# Patient Record
Sex: Female | Born: 1955 | Race: White | Hispanic: No | Marital: Married | Smoking: Former smoker
Health system: Southern US, Community
[De-identification: ages and names within clinical notes are randomized; demographics above are authoritative.]

## PROBLEM LIST (undated history)

## (undated) DIAGNOSIS — C801 Malignant (primary) neoplasm, unspecified: Secondary | ICD-10-CM

## (undated) DIAGNOSIS — E119 Type 2 diabetes mellitus without complications: Secondary | ICD-10-CM

## (undated) DIAGNOSIS — I10 Essential (primary) hypertension: Secondary | ICD-10-CM

## (undated) DIAGNOSIS — E785 Hyperlipidemia, unspecified: Secondary | ICD-10-CM

## (undated) DIAGNOSIS — C3490 Malignant neoplasm of unspecified part of unspecified bronchus or lung: Secondary | ICD-10-CM

## (undated) HISTORY — PX: BREAST BIOPSY: SHX20

## (undated) HISTORY — PX: LUNG CANCER SURGERY: SHX702

## (undated) HISTORY — PX: TONSILLECTOMY: SUR1361

## (undated) HISTORY — PX: CERVICAL SPINE SURGERY: SHX589

## (undated) HISTORY — DX: Hyperlipidemia, unspecified: E78.5

---

## 2007-07-26 ENCOUNTER — Emergency Department: Payer: Self-pay | Admitting: Emergency Medicine

## 2010-04-01 ENCOUNTER — Ambulatory Visit: Payer: Self-pay | Admitting: Family Medicine

## 2010-12-08 ENCOUNTER — Ambulatory Visit: Payer: Self-pay | Admitting: Family Medicine

## 2011-04-25 ENCOUNTER — Ambulatory Visit: Payer: Self-pay | Admitting: Family Medicine

## 2012-07-28 DIAGNOSIS — E119 Type 2 diabetes mellitus without complications: Secondary | ICD-10-CM | POA: Insufficient documentation

## 2012-11-17 DIAGNOSIS — E7800 Pure hypercholesterolemia, unspecified: Secondary | ICD-10-CM | POA: Insufficient documentation

## 2012-12-05 ENCOUNTER — Emergency Department: Payer: Self-pay | Admitting: Emergency Medicine

## 2012-12-15 ENCOUNTER — Ambulatory Visit: Payer: Self-pay | Admitting: Orthopedic Surgery

## 2013-03-03 ENCOUNTER — Ambulatory Visit: Payer: Self-pay | Admitting: Emergency Medicine

## 2013-03-03 LAB — RAPID STREP-A WITH REFLX: Micro Text Report: NEGATIVE

## 2013-03-06 LAB — BETA STREP CULTURE(ARMC)

## 2014-02-27 DIAGNOSIS — R928 Other abnormal and inconclusive findings on diagnostic imaging of breast: Secondary | ICD-10-CM | POA: Insufficient documentation

## 2014-03-26 DIAGNOSIS — Z85828 Personal history of other malignant neoplasm of skin: Secondary | ICD-10-CM | POA: Insufficient documentation

## 2014-06-28 ENCOUNTER — Ambulatory Visit: Payer: Self-pay | Admitting: Family Medicine

## 2014-06-28 LAB — RAPID STREP-A WITH REFLX: Micro Text Report: NEGATIVE

## 2014-07-01 LAB — BETA STREP CULTURE(ARMC)

## 2015-05-09 ENCOUNTER — Other Ambulatory Visit: Payer: Self-pay | Admitting: Rehabilitation

## 2015-05-09 DIAGNOSIS — M4722 Other spondylosis with radiculopathy, cervical region: Secondary | ICD-10-CM

## 2015-05-20 ENCOUNTER — Ambulatory Visit
Admission: RE | Admit: 2015-05-20 | Discharge: 2015-05-20 | Disposition: A | Discharge status: Home / Self Care | Payer: 59 | Source: Ambulatory Visit | Attending: Rehabilitation | Admitting: Rehabilitation

## 2015-05-20 ENCOUNTER — Ambulatory Visit: Payer: Self-pay

## 2015-05-20 DIAGNOSIS — M4722 Other spondylosis with radiculopathy, cervical region: Secondary | ICD-10-CM

## 2015-05-21 ENCOUNTER — Ambulatory Visit
Admission: RE | Admit: 2015-05-21 | Discharge: 2015-05-21 | Disposition: A | Discharge status: Home / Self Care | Payer: 59 | Source: Ambulatory Visit | Attending: Rehabilitation | Admitting: Rehabilitation

## 2015-05-21 DIAGNOSIS — M4802 Spinal stenosis, cervical region: Secondary | ICD-10-CM | POA: Insufficient documentation

## 2015-05-21 DIAGNOSIS — M50221 Other cervical disc displacement at C4-C5 level: Secondary | ICD-10-CM | POA: Insufficient documentation

## 2015-05-21 DIAGNOSIS — M4722 Other spondylosis with radiculopathy, cervical region: Secondary | ICD-10-CM | POA: Insufficient documentation

## 2015-10-07 ENCOUNTER — Ambulatory Visit (INDEPENDENT_AMBULATORY_CARE_PROVIDER_SITE_OTHER): Payer: 59 | Admitting: Neurology

## 2015-10-07 ENCOUNTER — Ambulatory Visit (INDEPENDENT_AMBULATORY_CARE_PROVIDER_SITE_OTHER): Payer: Self-pay | Admitting: Neurology

## 2015-10-07 DIAGNOSIS — Z0289 Encounter for other administrative examinations: Secondary | ICD-10-CM

## 2015-10-07 DIAGNOSIS — M79601 Pain in right arm: Secondary | ICD-10-CM | POA: Diagnosis not present

## 2015-10-07 NOTE — Progress Notes (Signed)
See procedure note.

## 2015-10-07 NOTE — Progress Notes (Signed)
  [facility]    Provider:  Dr Jaynee Eagles Referring Provider: Gayland Curry, MD Primary Care Physician:  Gayland Curry, MD  CC:  Right arm pain.  HPI:  Laurie Lyons is a 60 y.o. female here as a referral from Dr. Astrid Divine for right arm pain and right C6 symptoms.   Summary: Nerve Conduction studies were performed on the bilateral upper extremities.  ADM Ulnar and APB Median motor conductions were within normal limits with normal F wave latencies.  2nd-digit Median, 5th-digit Ulnar and radial sensory conductions were within normal limits.   EMG needle evaluation was performed on selected right upper extremity muscles: The right Infraspinatus, right Supraspinatus, right Deltoid, right Biceps, right Triceps, right Brachioradialis, right Extensor indicis, right Extensor Digitorum Communis, right Pronator Teres, right First Dorsal Interoseous, right Opponens Pollicis(could not activate the opponens pollicis due to pain) and right C6/C7, C7/C8 paraspinal muscles were within normal limits.   Assessment/Plan:  This is a normal study. No electrophysiologic evidence for mononeuropathy, polyneuropathy, cervical radiculopathy, plexopathy or muscle disorder.  Sarina Ill, MD  [facility] [address] [place], [place] [address]  Phone [phone] Fax [phone]

## 2015-10-12 NOTE — Procedures (Signed)
  [id number]    Provider:  Dr Jaynee Eagles Referring Provider: Gayland Curry, MD Primary Care Physician:  Gayland Curry, MD  CC:  Right arm pain.  HPI:  Laurie Lyons is a 60 y.o. female here as a referral from Dr. Astrid Divine for right arm pain and right C6 symptoms.   Summary: Nerve Conduction studies were performed on the bilateral upper extremities.  ADM Ulnar and APB Median motor conductions were within normal limits with normal F wave latencies.  2nd-digit Median, 5th-digit Ulnar and radial sensory conductions were within normal limits.   EMG needle evaluation was performed on selected right upper extremity muscles: The right Infraspinatus, right Supraspinatus, right Deltoid, right Biceps, right Triceps, right Brachioradialis, right Extensor indicis, right Extensor Digitorum Communis, right Pronator Teres, right First Dorsal Interoseous, right Opponens Pollicis(could not activate the opponens pollicis due to pain) and right C6/C7, C7/C8 paraspinal muscles were within normal limits.   Assessment/Plan:  This is a normal study. No electrophysiologic evidence for mononeuropathy, polyneuropathy, cervical radiculopathy, plexopathy or muscle disorder.  Sarina Ill, MD  [facility] [address] [place],  [address]  Phone [phone] Fax [phone]

## 2016-03-18 ENCOUNTER — Other Ambulatory Visit
Admission: RE | Admit: 2016-03-18 | Discharge: 2016-03-18 | Disposition: A | Discharge status: Home / Self Care | Payer: 59 | Source: Ambulatory Visit | Attending: Gastroenterology | Admitting: Gastroenterology

## 2016-03-18 DIAGNOSIS — R197 Diarrhea, unspecified: Secondary | ICD-10-CM | POA: Insufficient documentation

## 2016-03-18 LAB — GASTROINTESTINAL PANEL BY PCR, STOOL (REPLACES STOOL CULTURE)
ASTROVIRUS: NOT DETECTED
Adenovirus F40/41: NOT DETECTED
CAMPYLOBACTER SPECIES: NOT DETECTED
Cryptosporidium: NOT DETECTED
Cyclospora cayetanensis: NOT DETECTED
E. coli O157: NOT DETECTED
ENTAMOEBA HISTOLYTICA: NOT DETECTED
ENTEROAGGREGATIVE E COLI (EAEC): NOT DETECTED
ENTEROPATHOGENIC E COLI (EPEC): NOT DETECTED
ENTEROTOXIGENIC E COLI (ETEC): NOT DETECTED
GIARDIA LAMBLIA: NOT DETECTED
NOROVIRUS GI/GII: NOT DETECTED
PLESIMONAS SHIGELLOIDES: NOT DETECTED
Rotavirus A: NOT DETECTED
SHIGELLA/ENTEROINVASIVE E COLI (EIEC): NOT DETECTED
Salmonella species: NOT DETECTED
Sapovirus (I, II, IV, and V): NOT DETECTED
Shiga like toxin producing E coli (STEC): NOT DETECTED
VIBRIO CHOLERAE: NOT DETECTED
Vibrio species: NOT DETECTED
Yersinia enterocolitica: NOT DETECTED

## 2016-03-18 LAB — C DIFFICILE QUICK SCREEN W PCR REFLEX
C DIFFICILE (CDIFF) INTERP: NOT DETECTED
C DIFFICILE (CDIFF) TOXIN: NEGATIVE
C Diff antigen: NEGATIVE

## 2016-06-08 ENCOUNTER — Ambulatory Visit
Admission: RE | Admit: 2016-06-08 | Discharge: 2016-06-08 | Disposition: A | Discharge status: Home / Self Care | Payer: 59 | Source: Ambulatory Visit | Attending: Gastroenterology | Admitting: Gastroenterology

## 2016-06-08 ENCOUNTER — Ambulatory Visit: Payer: 59 | Admitting: Anesthesiology

## 2016-06-08 ENCOUNTER — Encounter
Admission: RE | Disposition: A | Discharge status: Home / Self Care | Payer: Self-pay | Source: Ambulatory Visit | Attending: Gastroenterology

## 2016-06-08 ENCOUNTER — Encounter: Payer: Self-pay | Admitting: *Deleted

## 2016-06-08 DIAGNOSIS — D122 Benign neoplasm of ascending colon: Secondary | ICD-10-CM | POA: Diagnosis not present

## 2016-06-08 DIAGNOSIS — E119 Type 2 diabetes mellitus without complications: Secondary | ICD-10-CM | POA: Insufficient documentation

## 2016-06-08 DIAGNOSIS — Z7982 Long term (current) use of aspirin: Secondary | ICD-10-CM | POA: Diagnosis not present

## 2016-06-08 DIAGNOSIS — I10 Essential (primary) hypertension: Secondary | ICD-10-CM | POA: Insufficient documentation

## 2016-06-08 DIAGNOSIS — K621 Rectal polyp: Secondary | ICD-10-CM | POA: Insufficient documentation

## 2016-06-08 DIAGNOSIS — Z880 Allergy status to penicillin: Secondary | ICD-10-CM | POA: Insufficient documentation

## 2016-06-08 DIAGNOSIS — R197 Diarrhea, unspecified: Secondary | ICD-10-CM | POA: Diagnosis present

## 2016-06-08 HISTORY — DX: Type 2 diabetes mellitus without complications: E11.9

## 2016-06-08 HISTORY — PX: COLONOSCOPY: SHX5424

## 2016-06-08 HISTORY — DX: Essential (primary) hypertension: I10

## 2016-06-08 LAB — GLUCOSE, CAPILLARY: Glucose-Capillary: 225 mg/dL — ABNORMAL HIGH (ref 65–99)

## 2016-06-08 SURGERY — COLONOSCOPY
Anesthesia: General

## 2016-06-08 MED ORDER — ONDANSETRON HCL 4 MG/2ML IJ SOLN: 4.0000 mg | Freq: Once | INTRAMUSCULAR | Status: DC | PRN

## 2016-06-08 MED ORDER — FENTANYL CITRATE (PF) 100 MCG/2ML IJ SOLN
INTRAMUSCULAR | Status: DC | PRN
  Administered 2016-06-08: 50 ug via INTRAVENOUS

## 2016-06-08 MED ORDER — MIDAZOLAM HCL 5 MG/5ML IJ SOLN
INTRAMUSCULAR | Status: DC | PRN
  Administered 2016-06-08: 1 mg via INTRAVENOUS

## 2016-06-08 MED ORDER — SODIUM CHLORIDE 0.9 % IV SOLN: INTRAVENOUS | Status: DC

## 2016-06-08 MED ORDER — PROPOFOL 500 MG/50ML IV EMUL
INTRAVENOUS | Status: DC | PRN
  Administered 2016-06-08: 120 ug/kg/min via INTRAVENOUS

## 2016-06-08 MED ORDER — LIDOCAINE 2% (20 MG/ML) 5 ML SYRINGE
INTRAMUSCULAR | Status: DC | PRN
  Administered 2016-06-08: 50 mg via INTRAVENOUS

## 2016-06-08 MED ORDER — SODIUM CHLORIDE 0.9 % IV SOLN
INTRAVENOUS | Status: DC
  Administered 2016-06-08: 08:00:00 via INTRAVENOUS

## 2016-06-08 MED ORDER — FENTANYL CITRATE (PF) 100 MCG/2ML IJ SOLN: 25.0000 ug | INTRAMUSCULAR | Status: DC | PRN

## 2016-06-08 MED ORDER — PROPOFOL 10 MG/ML IV BOLUS
INTRAVENOUS | Status: DC | PRN
  Administered 2016-06-08: 30 mg via INTRAVENOUS
  Administered 2016-06-08: 70 mg via INTRAVENOUS

## 2016-06-08 NOTE — H&P (Signed)
Outpatient short stay form Pre-procedure 06/08/2016 7:44 AM Lollie Sails MD  Primary Physician: Dr. Gayland Curry  Reason for visit:  Colonoscopy  History of present illness:  Patient is a 60 year old female presenting today as above. She's had several months of change of bowel habits into a more of a diarrhea problem. She's not seen any blood in the stool. She's had no new or different medications. No new medication doses. She does take a 81 mg aspirin daily but is held that for a couple days. She takes no blood thinning agents or other aspirin products. She tolerated her prep although she did have some nausea.    Current Facility-Administered Medications:  .  0.9 %  sodium chloride infusion, , Intravenous, Continuous, Lollie Sails, MD, Last Rate: 50 mL/hr at 06/08/16 0738 .  0.9 %  sodium chloride infusion, , Intravenous, Continuous, Lollie Sails, MD .  fentaNYL (SUBLIMAZE) injection 25 mcg, 25 mcg, Intravenous, Q5 min PRN, Alvin Critchley, MD .  ondansetron [facility]) injection 4 mg, 4 mg, Intravenous, Once PRN, Alvin Critchley, MD  Prescriptions Prior to Admission  Medication Sig Dispense Refill Last Dose  . aspirin EC 81 MG tablet Take 81 mg by mouth daily.   Past Week at Unknown time  . gabapentin (NEURONTIN) 300 MG capsule Take 300 mg by mouth 3 (three) times daily.   Past Week at Unknown time  . glipiZIDE-metformin (METAGLIP) 5-500 MG tablet Take 1 tablet by mouth 2 (two) times daily before a meal.   Past Week at Unknown time  . lisinopril (PRINIVIL,ZESTRIL) 5 MG tablet Take 5 mg by mouth daily.   Past Week at Unknown time  . rosuvastatin (CRESTOR) 40 MG tablet Take 40 mg by mouth daily.   Past Week at Unknown time  . silver sulfADIAZINE (SILVADENE) 1 % cream Apply 1 application topically daily.   Completed Course at Unknown time     Allergies  Allergen Reactions  . Penicillins   . Sulfa Antibiotics      Past Medical History:  Diagnosis Date  . Diabetes  mellitus without complication ([place])   . Hypertension     Review of systems:      Physical Exam    Heart and lungs: Regular rate and rhythm without rub or gallop, lungs are bilaterally clear.    HEENT: Normocephalic atraumatic eyes are anicteric    Other:     Pertinant exam for procedure: Soft nontender nondistended bowel sounds positive normoactive.    Planned proceedures: Colonoscopy and indicated procedures. I have discussed the risks benefits and complications of procedures to include not limited to bleeding, infection, perforation and the risk of sedation and the patient wishes to proceed.    Lollie Sails, MD Gastroenterology 06/08/2016  7:44 AM

## 2016-06-08 NOTE — Op Note (Signed)
[facility] Patient Name: Laurie Lyons Procedure Date: 06/08/2016 7:49 AM MRN: [id number] Account #: [id number] Date of Birth: 10/10/1955 Admit Type: Outpatient Age: 60 Room: [facility] ENDO ROOM 3 Gender: Female Note Status: Finalized Procedure:            Colonoscopy Indications:          Change in bowel habits, Diarrhea Providers:            Lollie Sails, MD Referring MD:         Gayland Curry MD, MD (Referring MD) Medicines:            Monitored Anesthesia Care Complications:        No immediate complications. Procedure:            Pre-Anesthesia Assessment:                       - ASA Grade Assessment: II - A patient with mild                        systemic disease.                       After obtaining informed consent, the colonoscope was                        passed under direct vision. Throughout the procedure,                        the patient's blood pressure, pulse, and oxygen                        saturations were monitored continuously. The                        Colonoscope was introduced through the anus and                        advanced to the the cecum, identified by appendiceal                        orifice and ileocecal valve. The colonoscopy was                        performed without difficulty. The patient tolerated the                        procedure well. The quality of the bowel preparation                        was good. Findings:      A 2 to 3 mm polyp was found in the rectum. The polyp was sessile.       Polypectomy was attempted, initially using a cold biopsy forceps. Polyp       resection was incomplete with this device. This intervention then       required a different device and polypectomy technique. The polyp was       removed with a cold snare. Resection and retrieval were complete.      A 2 mm polyp was found in the ascending colon. The polyp was sessile.       The polyp  was removed with a cold  biopsy forceps. Resection and       retrieval were complete.      Biopsies for histology were taken with a cold forceps from the right       colon and left colon for evaluation of microscopic colitis.      Non-bleeding internal hemorrhoids were found during retroflexion. The       hemorrhoids were small and Grade I (internal hemorrhoids that do not       prolapse).      -of note , the colonic pathway seems relatively short Impression:           - One 2 to 3 mm polyp in the rectum, removed with a                        cold snare. Resected and retrieved.                       - One 2 mm polyp in the ascending colon, removed with a                        cold biopsy forceps. Resected and retrieved.                       - Non-bleeding internal hemorrhoids.                       - Biopsies were taken with a cold forceps from the                        right colon and left colon for evaluation of                        microscopic colitis. Recommendation:       - Discharge patient to home.                       - Telephone GI clinic for pathology results in 1 week.                       - Return to GI clinic in 4 weeks.                       - Use Citrucel one tablespoon PO daily daily.                       - lactose test Procedure Code(s):    --- Professional ---                       [phone], Colonoscopy, flexible; with removal of tumor(s),                        polyp(s), or other lesion(s) by snare technique                       45380, 59, Colonoscopy, flexible; with biopsy, single                        or multiple Diagnosis Code(s):    --- Professional ---  K62.1, Rectal polyp                       D12.2, Benign neoplasm of ascending colon                       K64.0, First degree hemorrhoids                       R19.4, Change in bowel habit                       R19.7, Diarrhea, unspecified CPT copyright 2016 American Medical Association. All rights  reserved. The codes documented in this report are preliminary and upon coder review may  be revised to meet current compliance requirements. Lollie Sails, MD 06/08/2016 8:38:58 AM This report has been signed electronically. Number of Addenda: 0 Note Initiated On: 06/08/2016 7:49 AM Scope Withdrawal Time: 0 hours 10 minutes 51 seconds  Total Procedure Duration: 0 hours 24 minutes 31 seconds       [facility]

## 2016-06-08 NOTE — Anesthesia Preprocedure Evaluation (Signed)
Anesthesia Evaluation  Patient identified by MRN, date of birth, ID band Patient awake    Reviewed: Allergy & Precautions, NPO status , Patient's Chart, lab work & pertinent test results  Airway Mallampati: II       Dental no notable dental hx.    Pulmonary neg pulmonary ROS,    Pulmonary exam normal        Cardiovascular hypertension, Pt. on medications Normal cardiovascular exam     Neuro/Psych negative neurological ROS     GI/Hepatic negative GI ROS, Neg liver ROS,   Endo/Other  negative endocrine ROS  Renal/GU negative Renal ROS  negative genitourinary   Musculoskeletal negative musculoskeletal ROS (+)   Abdominal Normal abdominal exam  (+)   Peds negative pediatric ROS (+)  Hematology negative hematology ROS (+)   Anesthesia Other Findings   Reproductive/Obstetrics                             Anesthesia Physical Anesthesia Plan  ASA: II  Anesthesia Plan: General   Post-op Pain Management:    Induction: Intravenous  Airway Management Planned: Nasal Cannula  Additional Equipment:   Intra-op Plan:   Post-operative Plan:   Informed Consent: I have reviewed the patients History and Physical, chart, labs and discussed the procedure including the risks, benefits and alternatives for the proposed anesthesia with the patient or authorized representative who has indicated his/her understanding and acceptance.   Dental advisory given  Plan Discussed with: CRNA and Surgeon  Anesthesia Plan Comments:         Anesthesia Quick Evaluation

## 2016-06-08 NOTE — Transfer of Care (Signed)
Immediate Anesthesia Transfer of Care Note  Patient: Laurie Lyons  Procedure(s) Performed: Procedure(s) with comments: COLONOSCOPY (N/A) - Diabetic  Patient Location: Endoscopy Unit  Anesthesia Type:General  Level of Consciousness: awake and alert   Airway & Oxygen Therapy: Patient Spontanous Breathing and Patient connected to nasal cannula oxygen  Post-op Assessment: Report given to RN and Post -op Vital signs reviewed and stable  Post vital signs: Reviewed  Last Vitals:  Vitals:   06/08/16 0727 06/08/16 0826  BP: 132/89 110/63  Pulse: 79 69  Resp: 20 18  Temp: (!) 35.8 C (!) 36.1 C    Last Pain:  Vitals:   06/08/16 0727  TempSrc: Tympanic         Complications: No apparent anesthesia complications

## 2016-06-09 ENCOUNTER — Encounter: Payer: Self-pay | Admitting: Gastroenterology

## 2016-06-09 LAB — SURGICAL PATHOLOGY

## 2016-06-09 NOTE — Anesthesia Postprocedure Evaluation (Signed)
Anesthesia Post Note  Patient: CYANNE DELMAR  Procedure(s) Performed: Procedure(s) (LRB): COLONOSCOPY (N/A)  Patient location during evaluation: PACU Anesthesia Type: General Level of consciousness: awake and alert and oriented Pain management: pain level controlled Vital Signs Assessment: post-procedure vital signs reviewed and stable Respiratory status: spontaneous breathing Cardiovascular status: blood pressure returned to baseline Anesthetic complications: no    Last Vitals:  Vitals:   06/08/16 0836 06/08/16 0856  BP: 113/63 116/74  Pulse: 63 62  Resp: 14 15  Temp:      Last Pain:  Vitals:   06/09/16 0740  TempSrc:   PainSc: 0-No pain                 Dacota Devall

## 2016-09-20 ENCOUNTER — Encounter: Payer: Self-pay | Admitting: *Deleted

## 2016-09-20 ENCOUNTER — Ambulatory Visit
Admission: EM | Admit: 2016-09-20 | Discharge: 2016-09-20 | Disposition: A | Discharge status: Home / Self Care | Payer: 59 | Attending: Family Medicine | Admitting: Family Medicine

## 2016-09-20 DIAGNOSIS — H8110 Benign paroxysmal vertigo, unspecified ear: Secondary | ICD-10-CM

## 2016-09-20 DIAGNOSIS — A084 Viral intestinal infection, unspecified: Secondary | ICD-10-CM | POA: Diagnosis not present

## 2016-09-20 MED ORDER — ONDANSETRON 8 MG PO TBDP: 8.0000 mg | ORAL_TABLET | Freq: Three times a day (TID) | ORAL | 0 refills | Status: AC | PRN

## 2016-09-20 MED ORDER — MECLIZINE HCL 12.5 MG PO TABS
12.5000 mg | ORAL_TABLET | Freq: Three times a day (TID) | ORAL | 0 refills | Status: AC | PRN
Start: 2016-09-20 — End: ?

## 2016-09-20 MED ORDER — ONDANSETRON 8 MG PO TBDP
8.0000 mg | ORAL_TABLET | Freq: Once | ORAL | Status: AC
  Administered 2016-09-20: 8 mg via ORAL

## 2016-09-20 NOTE — ED Provider Notes (Signed)
[facility]    CSN: [id number] Arrival date & time: 09/20/16  0918     History   Chief Complaint Chief Complaint  Patient presents with  . Dizziness  . Nausea  . Emesis  . Otalgia    HPI Laurie Lyons is a 61 y.o. female.   The history is provided by the patient.  Dizziness  Quality:  Vertigo Severity:  Moderate Onset quality:  Sudden Timing:  Constant Chronicity:  New Context: ear pain and head movement   Context: not when bending over, not with bowel movement, not with eye movement, not with inactivity, not with loss of consciousness, not with medication, not with physical activity, not when standing up and not when urinating   Associated symptoms: vomiting   Associated symptoms: no diarrhea   Emesis  Severity:  Moderate Duration:  10 hours Timing:  Intermittent Quality:  Stomach contents Progression:  Unchanged Chronicity:  New Recent urination:  Normal Associated symptoms: no diarrhea   Otalgia  Associated symptoms: vomiting   Associated symptoms: no diarrhea     Past Medical History:  Diagnosis Date  . Diabetes mellitus without complication ([place])   . Hypertension     There are no active problems to display for this patient.   Past Surgical History:  Procedure Laterality Date  . CESAREAN SECTION    . COLONOSCOPY N/A 06/08/2016   Procedure: COLONOSCOPY;  Surgeon: Lollie Sails, MD;  Location: [facility] ENDOSCOPY;  Service: Endoscopy;  Laterality: N/A;  Diabetic  . TONSILLECTOMY      OB History    No data available       Home Medications    Prior to Admission medications   Medication Sig Start Date End Date Taking? Authorizing Provider  gabapentin (NEURONTIN) 300 MG capsule Take 300 mg by mouth 3 (three) times daily.   Yes Historical Provider, MD  glipiZIDE-metformin (METAGLIP) 5-500 MG tablet Take 1 tablet by mouth 2 (two) times daily before a meal.   Yes Historical Provider, MD  lisinopril (PRINIVIL,ZESTRIL) 5 MG tablet Take  5 mg by mouth daily.   Yes Historical Provider, MD  rosuvastatin (CRESTOR) 40 MG tablet Take 40 mg by mouth daily.   Yes Historical Provider, MD  aspirin EC 81 MG tablet Take 81 mg by mouth daily.    Historical Provider, MD  meclizine (ANTIVERT) 12.5 MG tablet Take 1 tablet (12.5 mg total) by mouth 3 (three) times daily as needed for dizziness. 09/20/16   Norval Gable, MD  ondansetron (ZOFRAN ODT) 8 MG disintegrating tablet Take 1 tablet (8 mg total) by mouth every 8 (eight) hours as needed. 09/20/16   Norval Gable, MD  silver sulfADIAZINE (SILVADENE) 1 % cream Apply 1 application topically daily.    Historical Provider, MD    Family History History reviewed. No pertinent family history.  Social History Social History  Substance Use Topics  . Smoking status: Former Research scientist (life sciences)  . Smokeless tobacco: Never Used  . Alcohol use No     Allergies   Penicillins and Sulfa antibiotics   Review of Systems Review of Systems  HENT: Positive for ear pain.   Gastrointestinal: Positive for vomiting. Negative for diarrhea.  Neurological: Positive for dizziness.     Physical Exam Triage Vital Signs ED Triage Vitals  Enc Vitals Group     BP 09/20/16 0950 129/80     Pulse Rate 09/20/16 0950 71     Resp 09/20/16 0950 16     Temp 09/20/16 0950 97.6  F (36.4 C)     Temp Source 09/20/16 0950 Oral     SpO2 09/20/16 0950 97 %     Weight 09/20/16 0952 165 lb (74.8 kg)     Height 09/20/16 0952 '5\' 4"'$  (1.626 m)     Head Circumference --      Peak Flow --      Pain Score --      Pain Loc --      Pain Edu? --      Excl. in [place]? --    No data found.   Updated Vital Signs BP 129/80 (BP Location: Left Arm)   Pulse 71   Temp 97.6 F (36.4 C) (Oral)   Resp 16   Ht '5\' 4"'$  (1.626 m)   Wt 165 lb (74.8 kg)   SpO2 97%   BMI 28.32 kg/m   Visual Acuity Right Eye Distance:   Left Eye Distance:   Bilateral Distance:    Right Eye Near:   Left Eye Near:    Bilateral Near:     Physical Exam    Constitutional: She is oriented to person, place, and time. She appears well-developed and well-nourished. No distress.  HENT:  Head: Normocephalic and atraumatic.  Right Ear: Tympanic membrane, external ear and ear canal normal.  Left Ear: Tympanic membrane, external ear and ear canal normal.  Nose: No mucosal edema, rhinorrhea, nose lacerations, sinus tenderness, nasal deformity, septal deviation or nasal septal hematoma. No epistaxis.  No foreign bodies. Right sinus exhibits no maxillary sinus tenderness and no frontal sinus tenderness. Left sinus exhibits no maxillary sinus tenderness and no frontal sinus tenderness.  Mouth/Throat: Uvula is midline, oropharynx is clear and moist and mucous membranes are normal. No oropharyngeal exudate.  Eyes: Conjunctivae and EOM are normal. Pupils are equal, round, and reactive to light. Right eye exhibits no discharge. Left eye exhibits no discharge. No scleral icterus.  Neck: Normal range of motion. Neck supple. No thyromegaly present.  Cardiovascular: Normal rate, regular rhythm and normal heart sounds.   Pulmonary/Chest: Effort normal and breath sounds normal. No respiratory distress. She has no wheezes. She has no rales.  Abdominal: Soft. Bowel sounds are normal. She exhibits no distension and no mass. There is no tenderness. There is no rebound and no guarding. No hernia.  Lymphadenopathy:    She has no cervical adenopathy.  Neurological: She is alert and oriented to person, place, and time. She displays normal reflexes. No cranial nerve deficit or sensory deficit. She exhibits normal muscle tone. Coordination normal.  Positive Hallpike manuever  Skin: She is not diaphoretic.  Nursing note and vitals reviewed.    UC Treatments / Results  Labs (all labs ordered are listed, but only abnormal results are displayed) Labs Reviewed - No data to display  EKG  EKG Interpretation None       Radiology No results found.  Procedures Procedures  (including critical care time)  Medications Ordered in UC Medications  ondansetron (ZOFRAN-ODT) disintegrating tablet 8 mg (8 mg Oral Given 09/20/16 0956)     Initial Impression / Assessment and Plan / UC Course  I have reviewed the triage vital signs and the nursing notes.  Pertinent labs & imaging results that were available during my care of the patient were reviewed by me and considered in my medical decision making (see chart for details).      Final Clinical Impressions(s) / UC Diagnoses   Final diagnoses:  Benign paroxysmal positional vertigo, unspecified laterality  Viral  gastroenteritis    New Prescriptions Discharge Medication List as of 09/20/2016 10:49 AM    START taking these medications   Details  meclizine (ANTIVERT) 12.5 MG tablet Take 1 tablet (12.5 mg total) by mouth 3 (three) times daily as needed for dizziness., Starting Mon 09/20/2016, Normal    ondansetron (ZOFRAN ODT) 8 MG disintegrating tablet Take 1 tablet (8 mg total) by mouth every 8 (eight) hours as needed., Starting Mon 09/20/2016, Normal       1. diagnosis reviewed with patient 2. rx as per orders above; reviewed possible side effects, interactions, risks and benefits  3. Recommend supportive treatment with clear liquids/increased fluids, then increase diet as tolerated 4. Follow-up prn if symptoms worsen or don't improve   Norval Gable, MD 09/20/16 1416

## 2016-09-20 NOTE — ED Triage Notes (Signed)
Onset of dizziness last night, N/V this am. Dizziness worse this morning. Had left ear fullness and pain for 1 week.

## 2016-09-21 ENCOUNTER — Ambulatory Visit
Admission: EM | Admit: 2016-09-21 | Discharge: 2016-09-21 | Disposition: A | Discharge status: Home / Self Care | Payer: 59 | Attending: Family Medicine | Admitting: Family Medicine

## 2016-09-21 ENCOUNTER — Encounter: Payer: Self-pay | Admitting: *Deleted

## 2016-09-21 DIAGNOSIS — H6982 Other specified disorders of Eustachian tube, left ear: Secondary | ICD-10-CM

## 2016-09-21 MED ORDER — HYDROCODONE-ACETAMINOPHEN 5-325 MG PO TABS: 1.0000 | ORAL_TABLET | Freq: Four times a day (QID) | ORAL | 0 refills | Status: DC | PRN

## 2016-09-21 MED ORDER — FLUTICASONE PROPIONATE 50 MCG/ACT NA SUSP: 2.0000 | Freq: Every day | NASAL | 0 refills | Status: DC

## 2016-09-21 MED ORDER — CEFUROXIME AXETIL 250 MG PO TABS: ORAL_TABLET | ORAL | 0 refills | Status: DC

## 2016-09-21 NOTE — ED Triage Notes (Signed)
Pt seen here yesterday, returns today with increased left ear pain, fullness, and tenderness to mastoid process region and left jaw.

## 2016-09-21 NOTE — ED Provider Notes (Signed)
CSN: [id number]     Arrival date & time 09/21/16  1731 History   First MD Initiated Contact with Patient 09/21/16 1814     Chief Complaint  Patient presents with  . Otalgia   (Consider location/radiation/quality/duration/timing/severity/associated sxs/prior Treatment) HPI  61 year old female who was seen here yesterdaywith benign positional vertigo scribed Antivert. She states that she has improved with the dizziness having a tight on occasion now working well. She returns today complaining of right ear pain and muffling and pain extending down her jaw line and over the mastoid. She is afebrile , O2 sats 98% on room air.       Past Medical History:  Diagnosis Date  . Diabetes mellitus without complication ([place])   . Hypertension    Past Surgical History:  Procedure Laterality Date  . CESAREAN SECTION    . COLONOSCOPY N/A 06/08/2016   Procedure: COLONOSCOPY;  Surgeon: Lollie Sails, MD;  Location: [facility] ENDOSCOPY;  Service: Endoscopy;  Laterality: N/A;  Diabetic  . TONSILLECTOMY     History reviewed. No pertinent family history. Social History  Substance Use Topics  . Smoking status: Former Research scientist (life sciences)  . Smokeless tobacco: Never Used  . Alcohol use No   OB History    No data available     Review of Systems  Constitutional: Positive for activity change. Negative for chills, fatigue and fever.  HENT: Positive for ear pain.   All other systems reviewed and are negative.   Allergies  Penicillins and Sulfa antibiotics  Home Medications   Prior to Admission medications   Medication Sig Start Date End Date Taking? Authorizing Provider  aspirin EC 81 MG tablet Take 81 mg by mouth daily.    Historical Provider, MD  cefUROXime (CEFTIN) 250 MG tablet Take one tablet BID with food 09/21/16   Lorin Picket, PA-C  fluticasone (FLONASE) 50 MCG/ACT nasal spray Place 2 sprays into both nostrils daily. 09/21/16   Lorin Picket, PA-C  gabapentin (NEURONTIN) 300 MG capsule Take 300  mg by mouth 3 (three) times daily.    Historical Provider, MD  glipiZIDE-metformin (METAGLIP) 5-500 MG tablet Take 1 tablet by mouth 2 (two) times daily before a meal.    Historical Provider, MD  HYDROcodone-acetaminophen (NORCO/VICODIN) 5-325 MG tablet Take 1 tablet by mouth every 6 (six) hours as needed for moderate pain or severe pain. 09/21/16   Lorin Picket, PA-C  lisinopril (PRINIVIL,ZESTRIL) 5 MG tablet Take 5 mg by mouth daily.    Historical Provider, MD  meclizine (ANTIVERT) 12.5 MG tablet Take 1 tablet (12.5 mg total) by mouth 3 (three) times daily as needed for dizziness. 09/20/16   Norval Gable, MD  ondansetron (ZOFRAN ODT) 8 MG disintegrating tablet Take 1 tablet (8 mg total) by mouth every 8 (eight) hours as needed. 09/20/16   Norval Gable, MD  rosuvastatin (CRESTOR) 40 MG tablet Take 40 mg by mouth daily.    Historical Provider, MD  silver sulfADIAZINE (SILVADENE) 1 % cream Apply 1 application topically daily.    Historical Provider, MD   Meds Ordered and Administered this Visit  Medications - No data to display  BP (!) 148/76 (BP Location: Left Arm)   Pulse 72   Temp 97.8 F (36.6 C) (Oral)   Resp 16   Ht '5\' 3"'$  (1.6 m)   Wt 165 lb (74.8 kg)   SpO2 98%   BMI 29.23 kg/m  No data found.   Physical Exam  Constitutional: She is oriented to person,  place, and time. She appears well-developed and well-nourished. No distress.  HENT:  Head: Normocephalic and atraumatic.  Right Ear: External ear normal.  Nose: Nose normal.  Mouth/Throat: Oropharynx is clear and moist.  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 The TM is dark. Is tenderness to outpatient over the mastoid pain with movement of the article or tragus.  Eyes: EOM are normal. Pupils are equal, round, and reactive to light. Right eye exhibits no discharge. Left eye exhibits no discharge.  Neck: Normal range of motion. Neck supple.  Musculoskeletal: Normal range of motion.  Lymphadenopathy:    She has no cervical  adenopathy.  Neurological: She is alert and oriented to person, place, and time.  Skin: Skin is warm and dry. She is not diaphoretic.  Psychiatric: She has a normal mood and affect. Her behavior is normal. Judgment and thought content normal.  Nursing note and vitals reviewed.   Urgent Care Course     Procedures (including critical care time)  Labs Review Labs Reviewed - No data to display  Imaging Review No results found.   Visual Acuity Review  Right Eye Distance:   Left Eye Distance:   Bilateral Distance:    Right Eye Near:   Left Eye Near:    Bilateral Near:         MDM   1. Eustachian tube dysfunction, left    Discharge Medication List as of 09/21/2016  6:35 PM    START taking these medications   Details  cefUROXime (CEFTIN) 250 MG tablet Take one tablet BID with food, Normal    fluticasone (FLONASE) 50 MCG/ACT nasal spray Place 2 sprays into both nostrils daily., Starting Tue 09/21/2016, Normal    HYDROcodone-acetaminophen (NORCO/VICODIN) 5-325 MG tablet Take 1 tablet by mouth every 6 (six) hours as needed for moderate pain or severe pain., Starting Tue 09/21/2016, Print      Plan: 1. Test/x-ray results and diagnosis reviewed with patient 2. rx as per orders; risks, benefits, potential side effects reviewed with patient 3. Recommend supportive treatment with Motrin or Tylenol for pain saving the Vicodin for severe pain. Given her the name of ENT that she can follow-up with if she is not improving. 4. F/u prn if symptoms worsen or don't improve     Lorin Picket, PA-C 09/21/16 2158

## 2016-09-23 ENCOUNTER — Telehealth: Payer: Self-pay

## 2016-09-23 NOTE — Telephone Encounter (Signed)
Courtesy call back completed today after patient's visit at [facility]. Patient improved and will call back with any questions or concerns.  

## 2016-09-30 ENCOUNTER — Ambulatory Visit
Admission: EM | Admit: 2016-09-30 | Discharge: 2016-09-30 | Disposition: A | Discharge status: Home / Self Care | Payer: 59 | Attending: Emergency Medicine | Admitting: Emergency Medicine

## 2016-09-30 DIAGNOSIS — R05 Cough: Secondary | ICD-10-CM

## 2016-09-30 DIAGNOSIS — R059 Cough, unspecified: Secondary | ICD-10-CM

## 2016-09-30 MED ORDER — AEROCHAMBER PLUS MISC: 2 refills | Status: DC

## 2016-09-30 MED ORDER — ALBUTEROL SULFATE HFA 108 (90 BASE) MCG/ACT IN AERS: 1.0000 | INHALATION_SPRAY | Freq: Four times a day (QID) | RESPIRATORY_TRACT | 0 refills | Status: DC | PRN

## 2016-09-30 MED ORDER — HYDROCOD POLST-CPM POLST ER 10-8 MG/5ML PO SUER: 5.0000 mL | Freq: Two times a day (BID) | ORAL | 0 refills | Status: DC | PRN

## 2016-09-30 MED ORDER — IBUPROFEN 600 MG PO TABS: 600.0000 mg | ORAL_TABLET | Freq: Four times a day (QID) | ORAL | 0 refills | Status: AC | PRN

## 2016-09-30 NOTE — ED Triage Notes (Signed)
Pt c/o cough that's non productive, and its hurting her chest she is coughing so hard. She saw a provider yesterday that prescribed some cough medicine however she says it isnt working. Has the chills today. Denies fever.

## 2016-09-30 NOTE — Discharge Instructions (Signed)
2 puffs from your albuterol inhaler every 4-6 hours as needed for coughing, wheezing, chest tightness. 600 mg ibuprofen with 1 g of Tylenol 4 times a day. This will help with the chest achiness. Continue Flonase and start some saline nasal irrigation with a neti pot or Milta Deiters med sinus rinse. Follow up with one of the physicians above for routine care. Go to the ER for the signs and symptoms we discussed

## 2016-09-30 NOTE — ED Provider Notes (Signed)
HPI  SUBJECTIVE:  Laurie Lyons is a 61 y.o. female who presents with 3 days of a dry cough, diffuse intermittent chest tightness, achiness, wheezing, chills today and headache with coughing. She reports some shortness of breath. She also reports some rhinorrhea. She describes the chest pain as tightness, achiness, lasting 10-15 minutes and then resolving on its own. CP happens with cough. She's had identical symptoms with bronchitis. No nausea, diaphoresis, radiation to her neck, arm, back. No palpitations. No exertional positional component to this. She was seen by her PMDs office yesterday, refused an EKG, started on Tessalon pearls which she states is not helping. She tried Mucinex, Flonase, Tessalon and Advil 400 mg by mouth.  symptoms are worse with coughing. No alleviating factors. She denies fevers, bodyaches, chest congestion, GERD symptoms, nasal congestion, postnasal drip. She states that she can't sleep at night secondary to the cough. She took Advil within 6-8 hours of evaluation. She denies unintentional weight gain, lower extremity edema, calf pain, nocturia, orthopnea, abdominal pain, PND. She is a former smoker has a 10 year pack history of smoking, quit 10 years ago. Also history of diabetes, hypertension for which she takes lisinopril. She has a history of vertigo is curatively on cefuroxime for this. Also bronchitis and GERD which she states is not bothering her. No history of asthma, emphysema, COPD, MI, coronary artery disease, CHF, kidney disease. PMD: ALDRIDGE,BARBARA, MD  Patient states that she is looking for a new PMD.    Past Medical History:  Diagnosis Date  . Diabetes mellitus without complication ([place])   . Hypertension     Past Surgical History:  Procedure Laterality Date  . CESAREAN SECTION    . COLONOSCOPY N/A 06/08/2016   Procedure: COLONOSCOPY;  Surgeon: Lollie Sails, MD;  Location: [facility] ENDOSCOPY;  Service: Endoscopy;  Laterality: N/A;  Diabetic  .  TONSILLECTOMY      History reviewed. No pertinent family history.  Social History  Substance Use Topics  . Smoking status: Former Research scientist (life sciences)  . Smokeless tobacco: Never Used  . Alcohol use No    No current facility-administered medications for this encounter.   Current Outpatient Prescriptions:  .  aspirin EC 81 MG tablet, Take 81 mg by mouth daily., Disp: , Rfl:  .  cefUROXime (CEFTIN) 250 MG tablet, Take one tablet BID with food, Disp: 20 tablet, Rfl: 0 .  gabapentin (NEURONTIN) 300 MG capsule, Take 300 mg by mouth 3 (three) times daily., Disp: , Rfl:  .  glipiZIDE-metformin (METAGLIP) 5-500 MG tablet, Take 1 tablet by mouth 2 (two) times daily before a meal., Disp: , Rfl:  .  lisinopril (PRINIVIL,ZESTRIL) 5 MG tablet, Take 5 mg by mouth daily., Disp: , Rfl:  .  meclizine (ANTIVERT) 12.5 MG tablet, Take 1 tablet (12.5 mg total) by mouth 3 (three) times daily as needed for dizziness., Disp: 30 tablet, Rfl: 0 .  ondansetron (ZOFRAN ODT) 8 MG disintegrating tablet, Take 1 tablet (8 mg total) by mouth every 8 (eight) hours as needed., Disp: 6 tablet, Rfl: 0 .  rosuvastatin (CRESTOR) 40 MG tablet, Take 40 mg by mouth daily., Disp: , Rfl:  .  albuterol (PROVENTIL HFA;VENTOLIN HFA) 108 (90 Base) MCG/ACT inhaler, Inhale 1-2 puffs into the lungs every 6 (six) hours as needed for wheezing or shortness of breath., Disp: 1 Inhaler, Rfl: 0 .  chlorpheniramine-HYDROcodone (TUSSIONEX PENNKINETIC ER) 10-8 MG/5ML SUER, Take 5 mLs by mouth every 12 (twelve) hours as needed for cough., Disp: 120 mL, Rfl:  0 .  ibuprofen (ADVIL,MOTRIN) 600 MG tablet, Take 1 tablet (600 mg total) by mouth every 6 (six) hours as needed., Disp: 30 tablet, Rfl: 0 .  Spacer/Aero-Holding Chambers (AEROCHAMBER PLUS) inhaler, Use as instructed, Disp: 1 each, Rfl: 2  Allergies  Allergen Reactions  . Penicillins   . Sulfa Antibiotics      ROS  As noted in HPI.   Physical Exam  BP 134/70 (BP Location: Left Arm)   Pulse 90    Temp 97.7 F (36.5 C) (Oral)   Resp 18   Ht '5\' 3"'$  (1.6 m)   Wt 165 lb (74.8 kg)   SpO2 100%   BMI 29.23 kg/m   Constitutional: Well developed, well nourished, no acute distress Eyes: PERRL, EOMI, conjunctiva normal bilaterally HENT: Normocephalic, atraumatic,mucus membranes moist. Mild nasal congestion, normal turbinates. Oropharynx normal, tonsils surgically absent. No obvious postnasal drip  Respiratory: Clear to auscultation bilaterally, no rales, no wheezing, no rhonchi no chest wall tenderness  Cardiovascular: Normal rate and rhythm, no murmurs, no gallops, no rubs GI: nondistended, skin: No rash, skin intact Musculoskeletal: Calves symmetric, nontender, No edema, no tenderness, no deformities Neurologic: Alert & oriented x 3, CN II-XII grossly intact, no motor deficits, sensation grossly intact Psychiatric: Speech and behavior appropriate   ED Course   Medications - No data to display  No orders of the defined types were placed in this encounter.  No results found for this or any previous visit (from the past 24 hour(s)). No results found.  ED Clinical Impression  Cough   ED Assessment/Plan  Offered to do an EKG given chest achiness and tightness, but patient refused. States that she has had similar symptoms like this before with bronchitis and given that she does not have any radiation, exertional positional component, signs or symptoms of CHF and no history of coronary artery disease/MI, and has a h/o smoking- probably has some residual lung damage/disease. Presentation most consistent with a URI and some bronchospasm. She does not have any rhonchi or rales. no steroids as she is a diabetic. Doubt influenza. In the differential is ACEi induced cough, acid reflux. Doubt PE. Doubt pneumonia, lungs are clear, she is satting 100% on room air. Chest x-ray was deferred today. Plan sent home on albuterol with spacer, we will increase her Advil 600 mg with 1 g of Tylenol 4 times  a day, Tussionex. She is continue the [employer], will also provide primary care referral. Discussed medical decision-making, plan for follow-up, signs and symptoms that  should prompt return to the ED. Patient  agrees with plan.   Meds ordered this encounter  Medications  . albuterol (PROVENTIL HFA;VENTOLIN HFA) 108 (90 Base) MCG/ACT inhaler    Sig: Inhale 1-2 puffs into the lungs every 6 (six) hours as needed for wheezing or shortness of breath.    Dispense:  1 Inhaler    Refill:  0  . Spacer/Aero-Holding Chambers (AEROCHAMBER PLUS) inhaler    Sig: Use as instructed    Dispense:  1 each    Refill:  2  . chlorpheniramine-HYDROcodone (TUSSIONEX PENNKINETIC ER) 10-8 MG/5ML SUER    Sig: Take 5 mLs by mouth every 12 (twelve) hours as needed for cough.    Dispense:  120 mL    Refill:  0  . ibuprofen (ADVIL,MOTRIN) 600 MG tablet    Sig: Take 1 tablet (600 mg total) by mouth every 6 (six) hours as needed.    Dispense:  30 tablet    Refill:  0    *This clinic note was created using [employer]. Therefore, there may be occasional mistakes despite careful proofreading.  ?   Melynda Ripple, MD 09/30/16 [phone]

## 2016-10-14 ENCOUNTER — Ambulatory Visit
Admission: EM | Admit: 2016-10-14 | Discharge: 2016-10-14 | Disposition: A | Discharge status: Home / Self Care | Payer: 59 | Attending: Family Medicine | Admitting: Family Medicine

## 2016-10-14 ENCOUNTER — Ambulatory Visit (INDEPENDENT_AMBULATORY_CARE_PROVIDER_SITE_OTHER): Payer: 59

## 2016-10-14 DIAGNOSIS — J40 Bronchitis, not specified as acute or chronic: Secondary | ICD-10-CM | POA: Diagnosis not present

## 2016-10-14 DIAGNOSIS — J209 Acute bronchitis, unspecified: Secondary | ICD-10-CM | POA: Diagnosis not present

## 2016-10-14 DIAGNOSIS — R0981 Nasal congestion: Secondary | ICD-10-CM

## 2016-10-14 MED ORDER — AZITHROMYCIN 250 MG PO TABS: ORAL_TABLET | ORAL | 0 refills | Status: DC

## 2016-10-14 MED ORDER — PREDNISONE 10 MG PO TABS: ORAL_TABLET | ORAL | 0 refills | Status: DC

## 2016-10-14 MED ORDER — IPRATROPIUM-ALBUTEROL 0.5-2.5 (3) MG/3ML IN SOLN
3.0000 mL | Freq: Four times a day (QID) | RESPIRATORY_TRACT | Status: DC
  Administered 2016-10-14: 3 mL via RESPIRATORY_TRACT

## 2016-10-14 MED ORDER — HYDROCOD POLST-CPM POLST ER 10-8 MG/5ML PO SUER: 5.0000 mL | Freq: Every evening | ORAL | 0 refills | Status: DC | PRN

## 2016-10-14 NOTE — ED Provider Notes (Signed)
[facility] ____________________________________________  Time seen: Approximately 8:46 PM  I have reviewed the triage vital signs and the nursing notes.   HISTORY  Chief Complaint Cough  HPI Laurie Lyons is a 61 y.o. female presenting for the complaint of 2-3 weeks of cough and chest congestion. Patient reports she has had some runny nose intermittently for the last month. Patient reports in the last 2-3 weeks the cough has lingered. Reports some associated wheezing. States occasional sensation of shortness of breath and tightness in lungs with cough and wheezing sensation. States cough had been dry and hacking, but reports intermittent greenish phlegm production. Denies hemoptysis. Patient reports that she does have some tightness in her chest with deep breath. Denies chest pain. Reports has continued to remain active. Denies fevers.   Patient reports approximately a month ago she was treated for vertigo and an ear infection that has since fully resolved. Reports she was treated with oral cefdinir.Patient reports that she was seen in urgent care proximally 2 weeks ago for the same complaints. Patient reports that she has been using the nasal saline rinse, albuterol inhaler and cough medicine as needed. Patient reports that she felt like she was starting to improve but then worsened again over the last week. Patient reports chief complaint is her cough. Denies any other recent medication changes.  Denies chest pain, abdominal pain, dysuria, paresthesias, extremity pain, extremity swelling or rash. Denies recent travel, long trips. Denies extremity swelling. Patient has a past medical history of diabetes management with oral agents, hypertension and hyperlipidemia. Patient reports that she has had bronchitis in the past with similar presentation. Denies history of asthma COPD emphysema CAD, cardiac history. Reports past smoker not current.  Gayland Curry, MD: PCP No LMP  recorded. Patient is postmenopausal.    Past Medical History:  Diagnosis Date  . Diabetes mellitus without complication ([place])   . Hypertension     There are no active problems to display for this patient.   Past Surgical History:  Procedure Laterality Date  . CESAREAN SECTION    . COLONOSCOPY N/A 06/08/2016   Procedure: COLONOSCOPY;  Surgeon: Lollie Sails, MD;  [facility] ENDOSCOPY;  Service: Endoscopy;  Laterality: N/A;  Diabetic  . TONSILLECTOMY        Current Facility-Administered Medications:  .  ipratropium-albuterol (DUONEB) 0.5-2.5 (3) MG/3ML nebulizer solution 3 mL, 3 mL, Nebulization, Q6H, Marylene Land, NP, 3 mL at 10/14/16 2001  Current Outpatient Prescriptions:  .  albuterol (PROVENTIL HFA;VENTOLIN HFA) 108 (90 Base) MCG/ACT inhaler, Inhale 1-2 puffs into the lungs every 6 (six) hours as needed for wheezing or shortness of breath., Disp: 1 Inhaler, Rfl: 0 .  aspirin EC 81 MG tablet, Take 81 mg by mouth daily., Disp: , Rfl:  .  azithromycin (ZITHROMAX Z-PAK) 250 MG tablet, Take 2 tablets (500 mg) on  Day 1,  followed by 1 tablet (250 mg) once daily on Days 2 through 5., Disp: 6 each, Rfl: 0 .  cefUROXime (CEFTIN) 250 MG tablet, Take one tablet BID with food, Disp: 20 tablet, Rfl: 0 .  chlorpheniramine-HYDROcodone (TUSSIONEX PENNKINETIC ER) 10-8 MG/5ML SUER, Take 5 mLs by mouth at bedtime as needed for cough. do not drive or operate machinery while taking as can cause drowsiness., Disp: 75 mL, Rfl: 0 .  gabapentin (NEURONTIN) 300 MG capsule, Take 300 mg by mouth 3 (three) times daily., Disp: , Rfl:  .  glipiZIDE-metformin (METAGLIP) 5-500 MG tablet, Take 1 tablet by mouth 2 (two) times  daily before a meal., Disp: , Rfl:  .  ibuprofen (ADVIL,MOTRIN) 600 MG tablet, Take 1 tablet (600 mg total) by mouth every 6 (six) hours as needed., Disp: 30 tablet, Rfl: 0 .  lisinopril (PRINIVIL,ZESTRIL) 5 MG tablet, Take 5 mg by mouth daily., Disp: , Rfl:  .  meclizine  (ANTIVERT) 12.5 MG tablet, Take 1 tablet (12.5 mg total) by mouth 3 (three) times daily as needed for dizziness., Disp: 30 tablet, Rfl: 0 .  ondansetron (ZOFRAN ODT) 8 MG disintegrating tablet, Take 1 tablet (8 mg total) by mouth every 8 (eight) hours as needed., Disp: 6 tablet, Rfl: 0 .  predniSONE (DELTASONE) 10 MG tablet, Start 60 mg po day one, then 50 mg po day two, taper by 10 mg daily until complete., Disp: 21 tablet, Rfl: 0 .  rosuvastatin (CRESTOR) 40 MG tablet, Take 40 mg by mouth daily., Disp: , Rfl:  .  Spacer/Aero-Holding Chambers (AEROCHAMBER PLUS) inhaler, Use as instructed, Disp: 1 each, Rfl: 2  Allergies Penicillins and Sulfa antibiotics   pertinent family history. Sister: Lung cancer smoker Denies family history of DVT or PEs.  Social History Social History  Substance Use Topics  . Smoking status: Former Research scientist (life sciences)  . Smokeless tobacco: Never Used  . Alcohol use No    Review of Systems Constitutional: No fever/chills Eyes: No visual changes. ENT: No sore throat. Cardiovascular: Denies chest pain. Respiratory: Denies shortness of breath. Gastrointestinal: No abdominal pain.  No nausea, no vomiting.  No diarrhea.  No constipation. Genitourinary: Negative for dysuria. Musculoskeletal: Negative for back pain. Skin: Negative for rash. Neurological: Negative for headaches, focal weakness or numbness.  10-point ROS otherwise negative.  ____________________________________________   PHYSICAL EXAM:  VITAL SIGNS: ED Triage Vitals [10/14/16 1920]  Enc Vitals Group     BP 140/72     Pulse Rate 83     Resp 18     Temp 98 F (36.7 C)     Temp Source Oral     SpO2 98 %     Weight 165 lb (74.8 kg)     Height '5\' 3"'$  (1.6 m)     Head Circumference      Peak Flow      Pain Score 9     Pain Loc      Pain Edu?      Excl. in [place]?     Constitutional: Alert and oriented. Well appearing and in no acute distress. Eyes: Conjunctivae are normal. PERRL. EOMI. Head:  Atraumatic. No sinus tenderness to palpation. No swelling. No erythema.  Ears: no erythema, normal TMs bilaterally.   Nose:Nasal congestion with clear rhinorrhea  Mouth/Throat: Mucous membranes are moist. No pharyngeal erythema. No tonsillar swelling or exudate.  Neck: No stridor.  No cervical spine tenderness to palpation. Hematological/Lymphatic/Immunilogical: No cervical lymphadenopathy. Cardiovascular: Normal rate, regular rhythm. Grossly normal heart sounds.  Good peripheral circulation. Respiratory: Normal respiratory effort.  No retractions. Good air movement. Mild scattered inspiratory wheezes. No rhonchi. Intermittent dry cough noted in room with bronchospasm. Gastrointestinal: Soft and nontender. No CVA tenderness. Musculoskeletal: Ambulatory with steady gait. No cervical, thoracic or lumbar tenderness to palpation. Bilateral pedal pulses equal and easily palpated.      Right lower leg:  No tenderness or edema.      Left lower leg:  No tenderness or edema.  Neurologic:  Normal speech and language. No gait instability. Skin:  Skin appears warm, dry and intact. No rash noted. Psychiatric: Mood and affect are normal. Speech  and behavior are normal.  ___________________________________________   LABS (all labs ordered are listed, but only abnormal results are displayed)  Labs Reviewed - No data to display ____________________________________________  EKG  Discussed evaluation of ekg, pt declined.  ____________________________________________  RADIOLOGY  Dg Chest 2 View  Result Date: 10/14/2016 CLINICAL DATA:  Cough and wheezing for 3 weeks. Shortness of breath. EXAM: CHEST  2 VIEW COMPARISON:  None. FINDINGS: The cardiomediastinal contours are normal. Mild lower lobe bronchial thickening. Pulmonary vasculature is normal. No consolidation, pleural effusion, or pneumothorax. No acute osseous abnormalities are seen. IMPRESSION: Mild lower lobe bronchial thickening.  No pneumonia.  Electronically Signed   By: Jeb Levering M.D.   On: 10/14/2016 20:04   ____________________________________________   PROCEDURES Procedures   INITIAL IMPRESSION / ASSESSMENT AND PLAN / ED COURSE  Pertinent labs & imaging results that were available during my care of the patient were reviewed by me and considered in my medical decision making (see chart for details).  Very well-appearing patient. No acute distress. Patient presenting for the complaints of continued cough and congestion. Wheezes noted. Discussed with patient recommend evaluating chest x-ray as well as DuoNeb treatment. Patient declines EKG. Also discussed in detail regarding evaluation of d-dimer to further evaluate for concern of embolus, in discussing with patient patient declined. Patient verbalized understanding and risk factors even up to death of pulmonary embolus, and patient declined, stating if she does not improve with conservative treatment she will return for further evaluation. Chest x-ray per radiologist mild lower lobe bronchial thickening, no pneumonia.  Discussed with patient we'll start patient on prednisone taper, and discussed monitoring blood sugar closely and altering diet. Continue home albuterol inhaler as needed. After doing had in urgent care, wheezes fully resolved, and patient reports improved. Azithromycin and prn Tussionex. Discussed strict follow-up and return parameters with patient.Discussed indication, risks and benefits of medications with patient.  Discussed follow up with Primary care physician this week. Discussed follow up and return parameters including no resolution or any worsening concerns. Patient verbalized understanding and agreed to plan.   ____________________________________________   FINAL CLINICAL IMPRESSION(S) / ED DIAGNOSES  Final diagnoses:  Bronchitis  Nasal congestion     Discharge Medication List as of 10/14/2016  8:45 PM    START taking these medications    Details  azithromycin (ZITHROMAX Z-PAK) 250 MG tablet Take 2 tablets (500 mg) on  Day 1,  followed by 1 tablet (250 mg) once daily on Days 2 through 5., Normal    predniSONE (DELTASONE) 10 MG tablet Start 60 mg po day one, then 50 mg po day two, taper by 10 mg daily until complete., Normal        Note: This dictation was prepared with Dragon dictation along with smaller phrase technology. Any transcriptional errors that result from this process are unintentional.         Marylene Land, NP 10/14/16 2105

## 2016-10-14 NOTE — Discharge Instructions (Signed)
Take medication as prescribed. Rest. Drink plenty of fluids. Continue home albuterol inhaler.   Follow up with your primary care physician this week as needed. Return to Urgent care or Emergency room for new or worsening concerns as discussed.

## 2016-10-14 NOTE — ED Triage Notes (Addendum)
Pt c/o cough and chest pains from coughing so much. She is coughing up phlegm. She has been here 2 times for this and is about out of cough medicine.

## 2016-11-04 ENCOUNTER — Other Ambulatory Visit: Payer: Self-pay | Admitting: Family Medicine

## 2016-11-04 DIAGNOSIS — Z1239 Encounter for other screening for malignant neoplasm of breast: Secondary | ICD-10-CM

## 2016-11-12 ENCOUNTER — Other Ambulatory Visit: Payer: Self-pay | Admitting: *Deleted

## 2016-11-12 ENCOUNTER — Inpatient Hospital Stay
Admission: RE | Admit: 2016-11-12 | Discharge: 2016-11-12 | Disposition: A | Discharge status: Home / Self Care | Payer: Self-pay | Source: Ambulatory Visit | Attending: *Deleted | Admitting: *Deleted

## 2016-11-12 DIAGNOSIS — Z1231 Encounter for screening mammogram for malignant neoplasm of breast: Secondary | ICD-10-CM

## 2017-01-18 ENCOUNTER — Ambulatory Visit
Admission: RE | Admit: 2017-01-18 | Discharge: 2017-01-18 | Disposition: A | Discharge status: Home / Self Care | Payer: 59 | Source: Ambulatory Visit | Attending: Family Medicine | Admitting: Family Medicine

## 2017-01-18 DIAGNOSIS — Z1231 Encounter for screening mammogram for malignant neoplasm of breast: Secondary | ICD-10-CM | POA: Insufficient documentation

## 2017-01-18 DIAGNOSIS — Z1239 Encounter for other screening for malignant neoplasm of breast: Secondary | ICD-10-CM

## 2017-01-18 HISTORY — DX: Malignant (primary) neoplasm, unspecified: C80.1

## 2017-02-07 ENCOUNTER — Other Ambulatory Visit: Payer: Self-pay | Admitting: Rehabilitation

## 2017-02-07 DIAGNOSIS — M50821 Other cervical disc disorders at C4-C5 level: Secondary | ICD-10-CM

## 2017-02-07 DIAGNOSIS — M542 Cervicalgia: Secondary | ICD-10-CM

## 2017-02-24 ENCOUNTER — Ambulatory Visit
Admission: RE | Admit: 2017-02-24 | Discharge: 2017-02-24 | Disposition: A | Discharge status: Home / Self Care | Payer: 59 | Source: Ambulatory Visit | Attending: Rehabilitation | Admitting: Rehabilitation

## 2017-02-24 DIAGNOSIS — M542 Cervicalgia: Secondary | ICD-10-CM

## 2017-02-24 DIAGNOSIS — M4802 Spinal stenosis, cervical region: Secondary | ICD-10-CM | POA: Insufficient documentation

## 2017-02-24 DIAGNOSIS — M50821 Other cervical disc disorders at C4-C5 level: Secondary | ICD-10-CM | POA: Insufficient documentation

## 2017-02-24 DIAGNOSIS — M47892 Other spondylosis, cervical region: Secondary | ICD-10-CM | POA: Insufficient documentation

## 2017-06-01 DIAGNOSIS — I10 Essential (primary) hypertension: Secondary | ICD-10-CM | POA: Insufficient documentation

## 2017-06-01 DIAGNOSIS — Z8739 Personal history of other diseases of the musculoskeletal system and connective tissue: Secondary | ICD-10-CM | POA: Insufficient documentation

## 2017-12-19 ENCOUNTER — Encounter: Payer: 59 | Attending: Family Medicine | Admitting: *Deleted

## 2017-12-19 ENCOUNTER — Encounter: Payer: Self-pay | Admitting: *Deleted

## 2017-12-19 VITALS — BP 100/70 | Ht 63.0 in | Wt 149.2 lb

## 2017-12-19 DIAGNOSIS — Z713 Dietary counseling and surveillance: Secondary | ICD-10-CM | POA: Insufficient documentation

## 2017-12-19 DIAGNOSIS — E119 Type 2 diabetes mellitus without complications: Secondary | ICD-10-CM | POA: Diagnosis not present

## 2017-12-19 NOTE — Patient Instructions (Addendum)
Check blood sugars 1-2 x day before breakfast and 2 hrs after one meal every day Bring blood sugar records to the next class  Exercise: Begin walking  for   10-15  minutes   3  days a week and gradually increase to 30 minutes 5 x week  Eat 3 meals day,  1-2 snacks a day Space meals 4-6 hours apart Increase your water intake  Carry fast acting glucose and a snack at all times  Return for classes on:

## 2017-12-19 NOTE — Progress Notes (Signed)
Diabetes Self-Management Education  Visit Type: First/Initial  Appt. Start Time: 1020 Appt. End Time: 1130  12/19/2017  Laurie Lyons, identified by name and date of birth, is a 62 y.o. female with a diagnosis of Diabetes: Type 2.   ASSESSMENT  Blood pressure 100/70, height 5\' 3"  (1.6 m), weight 149 lb 3.2 oz (67.7 kg). Body mass index is 26.43 kg/m.  Diabetes Self-Management Education - 12/19/17 1159      Visit Information   Visit Type  First/Initial      Initial Visit   Diabetes Type  Type 2    Are you currently following a meal plan?  Yes    What type of meal plan do you follow?  "no carbs"    Are you taking your medications as prescribed?  Yes    Date Diagnosed  3 years ago      Health Coping   How would you rate your overall health?  Good      Psychosocial Assessment   Patient Belief/Attitude about Diabetes  Motivated to manage diabetes    Self-care barriers  None    Self-management support  Doctor's office;Family    Patient Concerns  Nutrition/Meal planning;Glycemic Control;Medication    Special Needs  None    Preferred Learning Style  Auditory;Visual    Learning Readiness  Change in progress    How often do you need to have someone help you when you read instructions, pamphlets, or other written materials from your doctor or pharmacy?  1 - Never    What is the last grade level you completed in school?  12th      Pre-Education Assessment   Patient understands the diabetes disease and treatment process.  Needs Instruction    Patient understands incorporating nutritional management into lifestyle.  Needs Instruction    Patient undertands incorporating physical activity into lifestyle.  Needs Instruction    Patient understands using medications safely.  Needs Instruction    Patient understands monitoring blood glucose, interpreting and using results  Needs Review    Patient understands prevention, detection, and treatment of acute complications.  Needs  Instruction    Patient understands prevention, detection, and treatment of chronic complications.  Needs Instruction    Patient understands how to develop strategies to address psychosocial issues.  Needs Instruction    Patient understands how to develop strategies to promote health/change behavior.  Needs Instruction      Complications   Last HgB A1C per patient/outside source  11.5 % 11/21/17    How often do you check your blood sugar?  1-2 times/day    Fasting Blood glucose range (mg/dL)  130-179;180-200;>200 Pt reports FBG's 170-180's mg/dL and occasionally 200's mg/dL.     Have you had a dilated eye exam in the past 12 months?  Yes    Have you had a dental exam in the past 12 months?  Yes    Are you checking your feet?  Yes    How many days per week are you checking your feet?  7      Dietary Intake   Breakfast  omelet with egg white, tomatoes, spinach, onions, cheese with sausage and occasional wheat toast    Snack (morning)  fruit of banana, apple, Glucerna drink or Glucerna bar, sugar free oatmeal cookie    Lunch  chicken salad, spinach wrap and [place]    [employer], fish, beef with peas, beans, green beans, broccoli, cauliflower    Beverage(s)  waer, unsweetened tea,  coffee      Exercise   Exercise Type  ADL's      Patient Education   Previous Diabetes Education  No    Disease state   Definition of diabetes, type 1 and 2, and the diagnosis of diabetes;Factors that contribute to the development of diabetes    Nutrition management   Role of diet in the treatment of diabetes and the relationship between the three main macronutrients and blood glucose level;Carbohydrate counting;Reviewed blood glucose goals for pre and post meals and how to evaluate the patients' food intake on their blood glucose level.    Physical activity and exercise   Role of exercise on diabetes management, blood pressure control and cardiac health.    Medications  Reviewed patients medication for  diabetes, action, purpose, timing of dose and side effects.    Monitoring  Purpose and frequency of SMBG.;Taught/discussed recording of test results and interpretation of SMBG.;Identified appropriate SMBG and/or A1C goals.    Acute complications  Taught treatment of hypoglycemia - the 15 rule.    Chronic complications  Relationship between chronic complications and blood glucose control    Psychosocial adjustment  Identified and addressed patients feelings and concerns about diabetes      Individualized Goals (developed by patient)   Reducing Risk  Improve blood sugars Decrease medications Become more fit     Outcomes   Expected Outcomes  Demonstrated interest in learning. Expect positive outcomes       Individualized Plan for Diabetes Self-Management Training:   Learning Objective:  Patient will have a greater understanding of diabetes self-management. Patient education plan is to attend individual and/or group sessions per assessed needs and concerns.   Plan:   Patient Instructions  Check blood sugars 1-2 x day before breakfast and 2 hrs after one meal every day Bring blood sugar records to the next class Exercise: Begin walking  for   10-15  minutes   3  days a week and gradually increase to 30 minutes 5 x week Eat 3 meals day,  1-2 snacks a day Space meals 4-6 hours apart Increase your water intake Carry fast acting glucose and a snack at all times   Expected Outcomes:  Demonstrated interest in learning. Expect positive outcomes  Education material provided:  General Meal Planning Guidelines Simple Meal Plan Glucose tablets Symptoms, causes and treatments of Hypoglycemia  If problems or questions, patient to contact team via: Johny Drilling, [place], [place], CDE [phone]  Future DSME appointment:  Dec 26, 2017 for Diabetes Class 1

## 2017-12-26 ENCOUNTER — Ambulatory Visit: Payer: 59

## 2017-12-26 ENCOUNTER — Telehealth: Payer: Self-pay | Admitting: *Deleted

## 2017-12-26 NOTE — Telephone Encounter (Signed)
Received call from patient. She is not able to attend Diabetes class tonight. She has to work. Due to her work and vacation schedule for the summer she will not return until August 5 for Diabetes Class 1.

## 2018-01-02 ENCOUNTER — Ambulatory Visit: Payer: 59

## 2018-01-16 ENCOUNTER — Ambulatory Visit: Payer: 59

## 2018-03-20 ENCOUNTER — Encounter: Payer: Self-pay | Admitting: Dietician

## 2018-03-20 ENCOUNTER — Encounter: Payer: 59 | Attending: Family Medicine

## 2018-03-20 DIAGNOSIS — Z713 Dietary counseling and surveillance: Secondary | ICD-10-CM | POA: Insufficient documentation

## 2018-03-20 DIAGNOSIS — E119 Type 2 diabetes mellitus without complications: Secondary | ICD-10-CM | POA: Insufficient documentation

## 2018-03-20 NOTE — Progress Notes (Signed)
Pt did not come to class 1.

## 2018-04-10 ENCOUNTER — Encounter: Payer: Self-pay | Admitting: *Deleted

## 2019-02-07 DIAGNOSIS — Z122 Encounter for screening for malignant neoplasm of respiratory organs: Secondary | ICD-10-CM | POA: Insufficient documentation

## 2019-02-26 ENCOUNTER — Other Ambulatory Visit: Payer: Self-pay | Admitting: Orthopaedic Surgery

## 2019-02-26 DIAGNOSIS — M542 Cervicalgia: Secondary | ICD-10-CM

## 2019-02-28 ENCOUNTER — Other Ambulatory Visit: Payer: Self-pay | Admitting: Orthopaedic Surgery

## 2019-02-28 DIAGNOSIS — M542 Cervicalgia: Secondary | ICD-10-CM

## 2019-03-11 ENCOUNTER — Other Ambulatory Visit: Payer: Self-pay

## 2019-03-11 ENCOUNTER — Ambulatory Visit
Admission: RE | Admit: 2019-03-11 | Discharge: 2019-03-11 | Disposition: A | Discharge status: Home / Self Care | Payer: 59 | Source: Ambulatory Visit | Attending: Orthopaedic Surgery | Admitting: Orthopaedic Surgery

## 2019-03-11 DIAGNOSIS — M542 Cervicalgia: Secondary | ICD-10-CM

## 2019-03-20 ENCOUNTER — Other Ambulatory Visit: Payer: 59

## 2019-04-30 ENCOUNTER — Other Ambulatory Visit: Payer: Self-pay | Admitting: Orthopaedic Surgery

## 2019-04-30 DIAGNOSIS — M4322 Fusion of spine, cervical region: Secondary | ICD-10-CM

## 2019-04-30 DIAGNOSIS — M5001 Cervical disc disorder with myelopathy,  high cervical region: Secondary | ICD-10-CM

## 2019-05-09 ENCOUNTER — Other Ambulatory Visit: Payer: Self-pay

## 2019-05-09 ENCOUNTER — Ambulatory Visit
Admission: RE | Admit: 2019-05-09 | Discharge: 2019-05-09 | Disposition: A | Discharge status: Home / Self Care | Payer: 59 | Source: Ambulatory Visit | Attending: Orthopaedic Surgery | Admitting: Orthopaedic Surgery

## 2019-05-09 DIAGNOSIS — M5001 Cervical disc disorder with myelopathy,  high cervical region: Secondary | ICD-10-CM | POA: Diagnosis present

## 2019-05-09 DIAGNOSIS — M4322 Fusion of spine, cervical region: Secondary | ICD-10-CM

## 2020-01-09 DIAGNOSIS — U071 COVID-19: Secondary | ICD-10-CM | POA: Insufficient documentation

## 2020-03-26 DIAGNOSIS — Z87891 Personal history of nicotine dependence: Secondary | ICD-10-CM | POA: Insufficient documentation

## 2020-03-26 DIAGNOSIS — M545 Low back pain, unspecified: Secondary | ICD-10-CM | POA: Insufficient documentation

## 2021-02-01 ENCOUNTER — Ambulatory Visit (INDEPENDENT_AMBULATORY_CARE_PROVIDER_SITE_OTHER): Payer: 59

## 2021-02-01 ENCOUNTER — Ambulatory Visit
Admission: EM | Admit: 2021-02-01 | Discharge: 2021-02-01 | Disposition: A | Discharge status: Home / Self Care | Payer: 59

## 2021-02-01 ENCOUNTER — Other Ambulatory Visit: Payer: Self-pay

## 2021-02-01 DIAGNOSIS — J111 Influenza due to unidentified influenza virus with other respiratory manifestations: Secondary | ICD-10-CM

## 2021-02-01 DIAGNOSIS — J208 Acute bronchitis due to other specified organisms: Secondary | ICD-10-CM

## 2021-02-01 DIAGNOSIS — R059 Cough, unspecified: Secondary | ICD-10-CM | POA: Diagnosis not present

## 2021-02-01 DIAGNOSIS — R52 Pain, unspecified: Secondary | ICD-10-CM | POA: Diagnosis not present

## 2021-02-01 MED ORDER — HYDROCOD POLST-CPM POLST ER 10-8 MG/5ML PO SUER: 5.0000 mL | Freq: Every evening | ORAL | 0 refills | Status: DC | PRN

## 2021-02-01 MED ORDER — METHYLPREDNISOLONE 4 MG PO TBPK: ORAL_TABLET | ORAL | 0 refills | Status: DC

## 2021-02-01 MED ORDER — ALBUTEROL SULFATE (5 MG/ML) 0.5% IN NEBU: 2.5000 mg | INHALATION_SOLUTION | Freq: Four times a day (QID) | RESPIRATORY_TRACT | 0 refills | Status: DC | PRN

## 2021-02-01 NOTE — ED Provider Notes (Signed)
[facility]    CSN: [id number] Arrival date & time: 02/01/21  0946      History   Chief Complaint Chief Complaint  Patient presents with   Influenza   Cough   HPI Laurie Lyons is a 65 y.o. female who presents today for evaluation of cough, chest tightness and chest congestion.  The patient was around her grandson who was diagnosed with influenza last Sunday.  The patient began to experience fevers, chills, aches and pains, because of this she did perform a telemedicine visit with her primary care physician who prescribed her Tamiflu.  She states that the Tamiflu has significantly reduced the aches, pains and fever that she was experiencing.  The patient presents today because she continues to experience chest tightness and congestion.  She denies any facial pain, ear pain or throat discomfort.  She reports significant wheeze and a dry cough.  Denies any fevers or chills at home.  The patient does have a albuterol inhaler which she has been using without significant relief.  She was also prescribed Tessalon Perles which have not provided any significant relief.   Influenza Presenting symptoms: cough   Presenting symptoms: no fever, no nausea, no sore throat and no vomiting   Associated symptoms: nasal congestion   Cough Associated symptoms: no chest pain, no fever and no sore throat    Past Medical History:  Diagnosis Date   Cancer ([place])    skin ca   Diabetes mellitus without complication ([place])    Hyperlipidemia    Hypertension     There are no problems to display for this patient.   Past Surgical History:  Procedure Laterality Date   BREAST BIOPSY Right    stereo bx-[id number]-/clip   CERVICAL SPINE SURGERY     CESAREAN SECTION     COLONOSCOPY N/A 06/08/2016   Procedure: COLONOSCOPY;  Surgeon: Lollie Sails, MD;  Location: [facility] ENDOSCOPY;  Service: Endoscopy;  Laterality: N/A;  Diabetic   TONSILLECTOMY      OB History   No obstetric history on file.       Home Medications    Prior to Admission medications   Medication Sig Start Date End Date Taking? Authorizing Provider  albuterol (PROVENTIL HFA;VENTOLIN HFA) 108 (90 Base) MCG/ACT inhaler Inhale 1-2 puffs into the lungs every 6 (six) hours as needed for wheezing or shortness of breath. 09/30/16  Yes Melynda Ripple, MD  albuterol (PROVENTIL) (5 MG/ML) 0.5% nebulizer solution Take 0.5 mLs (2.5 mg total) by nebulization every 6 (six) hours as needed for wheezing or shortness of breath. 02/01/21  Yes Lattie Corns, PA-C  betamethasone dipropionate (DIPROLENE) 0.05 % ointment Apply 1 application topically 2 (two) times daily as needed.   Yes [provider]  chlorpheniramine-HYDROcodone (TUSSIONEX PENNKINETIC ER) 10-8 MG/5ML SUER Take 5 mLs by mouth at bedtime as needed for cough. 02/01/21  Yes Lattie Corns, PA-C  glipiZIDE (GLUCOTROL XL) 10 MG 24 hr tablet Take 1 tablet by mouth daily. 11/08/19  Yes [provider]  ibuprofen (ADVIL,MOTRIN) 600 MG tablet Take 1 tablet (600 mg total) by mouth every 6 (six) hours as needed. 09/30/16  Yes Melynda Ripple, MD  lisinopril (PRINIVIL,ZESTRIL) 10 MG tablet Take 10 mg by mouth daily. 06/04/17 02/01/21 Yes [provider]  meclizine (ANTIVERT) 12.5 MG tablet Take 1 tablet (12.5 mg total) by mouth 3 (three) times daily as needed for dizziness. 09/20/16  Yes Norval Gable, MD  metFORMIN (GLUCOPHAGE) 1000 MG tablet Take 1,000  mg by mouth 2 (two) times daily. 06/03/17 02/01/21 Yes [provider]  methylPREDNISolone (MEDROL DOSEPAK) 4 MG TBPK tablet Take per package instructions. 02/01/21  Yes Lattie Corns, PA-C  ondansetron (ZOFRAN ODT) 8 MG disintegrating tablet Take 1 tablet (8 mg total) by mouth every 8 (eight) hours as needed. 09/20/16  Yes Norval Gable, MD  oseltamivir (TAMIFLU) 75 MG capsule Take 75 mg by mouth 2 (two) times daily. 01/29/21  Yes [provider]  rosuvastatin (CRESTOR) 40 MG  tablet Take 40 mg by mouth daily.   Yes [provider]  RYBELSUS 7 MG TABS SMARTSIG:7 Milligram(s) By Mouth Daily 12/24/20  Yes [provider]  zolpidem (AMBIEN) 5 MG tablet Take 5 mg by mouth daily. 11/19/20  Yes [provider]    Family History Family History  Problem Relation Age of Onset   Breast cancer Cousin        mat cousin   Diabetes Father     Social History Social History   Tobacco Use   Smoking status: Former    Packs/day: 2.00    Years: 35.00    Pack years: 70.00    Types: Cigarettes    Quit date: 08/16/2006    Years since quitting: 14.4   Smokeless tobacco: Never  Substance Use Topics   Alcohol use: No   Drug use: No   Allergies   Empagliflozin, Dextromethorphan, Guanfacine, Hydrocodone, Penicillins, and Sulfa antibiotics   Review of Systems Review of Systems  Constitutional:  Negative for fever.  HENT:  Positive for congestion. Negative for sinus pain and sore throat.   Eyes: Negative.   Respiratory:  Positive for cough.   Cardiovascular:  Negative for chest pain.  Gastrointestinal:  Negative for nausea and vomiting.  Endocrine: Negative.   Genitourinary: Negative.   Musculoskeletal: Negative.   Skin: Negative.   Allergic/Immunologic: Negative.   Neurological: Negative.   Hematological: Negative.   Psychiatric/Behavioral: Negative.      Physical Exam Triage Vital Signs ED Triage Vitals  Enc Vitals Group     BP 02/01/21 1009 (!) 141/82     Pulse Rate 02/01/21 1009 86     Resp 02/01/21 1009 18     Temp 02/01/21 1009 98.1 F (36.7 C)     Temp Source 02/01/21 1009 Oral     SpO2 02/01/21 1009 98 %     Weight 02/01/21 1004 149 lb (67.6 kg)     Height 02/01/21 1004 5\' 3"  (1.6 m)     Head Circumference --      Peak Flow --      Pain Score 02/01/21 1004 9     Pain Loc --      Pain Edu? --      Excl. in [place]? --    No data found.  Updated Vital Signs BP (!) 141/82 (BP Location: Left Arm)   Pulse 86   Temp 98.1 F  (36.7 C) (Oral)   Resp 18   Ht 5\' 3"  (1.6 m)   Wt 149 lb (67.6 kg)   SpO2 98%   BMI 26.39 kg/m   Visual Acuity Right Eye Distance:   Left Eye Distance:   Bilateral Distance:    Right Eye Near:   Left Eye Near:    Bilateral Near:     Physical Exam Constitutional:      Appearance: Normal appearance. She is not ill-appearing.  HENT:     Right Ear: Tympanic membrane and ear canal normal.  Left Ear: Tympanic membrane and ear canal normal.     Nose: Nose normal.  Eyes:     Pupils: Pupils are equal, round, and reactive to light.  Cardiovascular:     Rate and Rhythm: Normal rate and regular rhythm.     Heart sounds: Normal heart sounds, S1 normal and S2 normal.  Pulmonary:     Breath sounds: Decreased air movement present. Examination of the right-upper field reveals wheezing. Examination of the left-upper field reveals wheezing. Examination of the right-middle field reveals wheezing. Examination of the left-middle field reveals wheezing. Examination of the right-lower field reveals wheezing. Examination of the left-lower field reveals wheezing. Wheezing present.  Musculoskeletal:     Cervical back: Normal range of motion.  Lymphadenopathy:     Cervical: No cervical adenopathy.  Neurological:     Mental Status: She is alert.     UC Treatments / Results  Labs (all labs ordered are listed, but only abnormal results are displayed) Labs Reviewed - No data to display  EKG   Radiology DG Chest 2 View  Result Date: 02/01/2021 CLINICAL DATA:  Cough, influenza, pain. EXAM: CHEST - 2 VIEW COMPARISON:  Prior chest radiographs 10/14/2016 and earlier. FINDINGS: Heart size within normal limits. Aortic atherosclerosis. No appreciable airspace consolidation. No evidence of pleural effusion or pneumothorax. No acute bony abnormality identified. IMPRESSION: No evidence of acute cardiopulmonary abnormality. Aortic Atherosclerosis (ICD10-I70.0). Electronically Signed   By: Kellie Simmering DO    On: 02/01/2021 10:33    Procedures Procedures (including critical care time)  Medications Ordered in UC Medications - No data to display  Initial Impression / Assessment and Plan / UC Course  I have reviewed the triage vital signs and the nursing notes.  Pertinent labs & imaging results that were available during my care of the patient were reviewed by me and considered in my medical decision making (see chart for details).     1.  Treatment options were discussed today with the patient. 2.  Chest x-ray negative for evidence of pneumonia, I believe symptoms are likely due to recent diagnosis of influenza. 3.  The patient was given a Medrol Dosepak in addition to albuterol nebulizer solution to use at home.  The patient does have a nebulizer at home to be able to use. 4.  The patient was given a prescription of Tussionex for nighttime cough relief. 5.  Follow-up if symptoms fail to improve, fevers develop or symptoms worsen. Final Clinical Impressions(s) / UC Diagnoses   Final diagnoses:  Influenza  Acute bronchitis due to other specified organisms     Discharge Instructions      Take medications as prescribed. Nebulizer solution as needed. Follow-up if no improvement.   ED Prescriptions     Medication Sig Dispense Auth. Provider   methylPREDNISolone (MEDROL DOSEPAK) 4 MG TBPK tablet Take per package instructions. 21 tablet Lattie Corns, PA-C   chlorpheniramine-HYDROcodone (TUSSIONEX PENNKINETIC ER) 10-8 MG/5ML SUER Take 5 mLs by mouth at bedtime as needed for cough. 70 mL Lattie Corns, PA-C   albuterol (PROVENTIL) (5 MG/ML) 0.5% nebulizer solution Take 0.5 mLs (2.5 mg total) by nebulization every 6 (six) hours as needed for wheezing or shortness of breath. 20 mL Lattie Corns, PA-C      PDMP not reviewed this encounter.   Lattie Corns, PA-C 02/01/21 1138

## 2021-02-01 NOTE — ED Triage Notes (Signed)
Patient states that she has been having fever, chills, cough and body aches since Tuesday. States that she feels like she cannot get the congestion out of her chest. States that her grandson was positive for the flu and she started having symptoms on Tuesday so she has been on Tamiflu.

## 2021-02-01 NOTE — Discharge Instructions (Addendum)
Take medications as prescribed. Nebulizer solution as needed. Follow-up if no improvement.

## 2021-07-07 DIAGNOSIS — R911 Solitary pulmonary nodule: Secondary | ICD-10-CM | POA: Insufficient documentation

## 2021-08-28 DIAGNOSIS — C3491 Malignant neoplasm of unspecified part of right bronchus or lung: Secondary | ICD-10-CM | POA: Insufficient documentation

## 2021-10-03 ENCOUNTER — Ambulatory Visit
Admission: EM | Admit: 2021-10-03 | Discharge: 2021-10-03 | Disposition: A | Discharge status: Home / Self Care | Payer: 59

## 2021-10-03 ENCOUNTER — Encounter: Payer: Self-pay | Admitting: Emergency Medicine

## 2021-10-03 ENCOUNTER — Ambulatory Visit (INDEPENDENT_AMBULATORY_CARE_PROVIDER_SITE_OTHER): Payer: 59

## 2021-10-03 ENCOUNTER — Other Ambulatory Visit: Payer: Self-pay

## 2021-10-03 DIAGNOSIS — R0602 Shortness of breath: Secondary | ICD-10-CM | POA: Diagnosis not present

## 2021-10-03 DIAGNOSIS — R059 Cough, unspecified: Secondary | ICD-10-CM

## 2021-10-03 DIAGNOSIS — J40 Bronchitis, not specified as acute or chronic: Secondary | ICD-10-CM

## 2021-10-03 DIAGNOSIS — J040 Acute laryngitis: Secondary | ICD-10-CM | POA: Diagnosis not present

## 2021-10-03 HISTORY — DX: Malignant neoplasm of unspecified part of unspecified bronchus or lung: C34.90

## 2021-10-03 MED ORDER — ALBUTEROL SULFATE HFA 108 (90 BASE) MCG/ACT IN AERS: 2.0000 | INHALATION_SPRAY | RESPIRATORY_TRACT | 0 refills | Status: AC | PRN

## 2021-10-03 MED ORDER — IPRATROPIUM-ALBUTEROL 0.5-2.5 (3) MG/3ML IN SOLN
3.0000 mL | Freq: Once | RESPIRATORY_TRACT | Status: AC
  Administered 2021-10-03: 3 mL via RESPIRATORY_TRACT

## 2021-10-03 MED ORDER — AZITHROMYCIN 250 MG PO TABS
ORAL_TABLET | ORAL | 0 refills | Status: DC
Start: 2021-10-03 — End: 2021-11-08

## 2021-10-03 MED ORDER — ALBUTEROL SULFATE (2.5 MG/3ML) 0.083% IN NEBU: 2.5000 mg | INHALATION_SOLUTION | Freq: Once | RESPIRATORY_TRACT | Status: DC

## 2021-10-03 NOTE — ED Provider Notes (Addendum)
[facility]    CSN: [id number] Arrival date & time: 10/03/21  1306      History   Chief Complaint Chief Complaint  Patient presents with   Cough   Shortness of Breath    HPI Laurie Lyons is a 66 y.o. female presents with cough, whezing and SOB x 3 days. This am when she was using her spirometer it caused her to cough green thick mucous. She has lost her voice x 2 days. Denies fever, or sweats. Had chills and fatigue yesterday. Her home covid test was negative. Denies nasal symptoms. Had lap thoracostomy 1/23 for stage 1 lung CA. She has soreness on her R mid chest  Past Medical History:  Diagnosis Date   Cancer ([place])    skin ca   Diabetes mellitus without complication ([place])    Hyperlipidemia    Hypertension    Lung cancer ([place])     There are no problems to display for this patient.   Past Surgical History:  Procedure Laterality Date   BREAST BIOPSY Right    stereo bx-neg-/clip   CERVICAL SPINE SURGERY     CESAREAN SECTION     COLONOSCOPY N/A 06/08/2016   Procedure: COLONOSCOPY;  Surgeon: Lollie Sails, MD;  Location: [facility] ENDOSCOPY;  Service: Endoscopy;  Laterality: N/A;  Diabetic   LUNG CANCER SURGERY Right    TONSILLECTOMY      OB History   No obstetric history on file.      Home Medications    Prior to Admission medications   Medication Sig Start Date End Date Taking? Authorizing Provider  albuterol (VENTOLIN HFA) 108 (90 Base) MCG/ACT inhaler Inhale 2 puffs into the lungs every 4 (four) hours as needed for wheezing or shortness of breath. 10/03/21  Yes Rodriguez-Southworth, Sunday Spillers, PA-C  azithromycin (ZITHROMAX Z-PAK) 250 MG tablet Take 2 today, then one qd x 4 days 10/03/21  Yes Rodriguez-Southworth, Sunday Spillers, PA-C  betamethasone dipropionate (DIPROLENE) 0.05 % ointment Apply 1 application topically 2 (two) times daily as needed.    [provider]  glipiZIDE (GLUCOTROL XL) 10 MG 24 hr tablet Take 1 tablet by mouth daily. 11/08/19    [provider]  ibuprofen (ADVIL,MOTRIN) 600 MG tablet Take 1 tablet (600 mg total) by mouth every 6 (six) hours as needed. 09/30/16   Melynda Ripple, MD  lisinopril (PRINIVIL,ZESTRIL) 10 MG tablet Take 10 mg by mouth daily. 06/04/17 02/01/21  [provider]  meclizine (ANTIVERT) 12.5 MG tablet Take 1 tablet (12.5 mg total) by mouth 3 (three) times daily as needed for dizziness. 09/20/16   Norval Gable, MD  metFORMIN (GLUCOPHAGE) 1000 MG tablet Take 1,000 mg by mouth 2 (two) times daily. 06/03/17 02/01/21  [provider]  ondansetron (ZOFRAN ODT) 8 MG disintegrating tablet Take 1 tablet (8 mg total) by mouth every 8 (eight) hours as needed. 09/20/16   Norval Gable, MD  pregabalin (LYRICA) 50 MG capsule Take 50 mg by mouth 3 (three) times daily. 09/04/21   [provider]  rosuvastatin (CRESTOR) 40 MG tablet Take 40 mg by mouth daily.    [provider]  RYBELSUS 7 MG TABS SMARTSIG:7 Milligram(s) By Mouth Daily 12/24/20   [provider]  zolpidem (AMBIEN) 5 MG tablet Take 5 mg by mouth daily. 11/19/20   [provider]    Family History Family History  Problem Relation Age of Onset   Breast cancer Cousin        mat cousin  Diabetes Father     Social History Social History   Tobacco Use   Smoking status: Former    Packs/day: 2.00    Years: 35.00    Pack years: 70.00    Types: Cigarettes    Quit date: 08/16/2006    Years since quitting: 15.1   Smokeless tobacco: Never  Vaping Use   Vaping Use: Never used  Substance Use Topics   Alcohol use: No   Drug use: No     Allergies   Empagliflozin, Dextromethorphan, Guanfacine, Hydrocodone, Penicillins, and Sulfa antibiotics   Review of Systems Review of Systems  Constitutional:  Positive for chills and fatigue. Negative for appetite change, diaphoresis and fever.  HENT:  Positive for postnasal drip and voice change. Negative for congestion, ear discharge, ear pain and  sore throat.   Eyes:  Negative for discharge.  Respiratory:  Positive for cough, shortness of breath and wheezing. Negative for chest tightness.   Cardiovascular:  Negative for chest pain.  Musculoskeletal:  Negative for myalgias.  Skin:  Negative for rash.  Neurological:  Negative for headaches.    Physical Exam Triage Vital Signs ED Triage Vitals  Enc Vitals Group     BP 10/03/21 1354 136/66     Pulse Rate 10/03/21 1354 (!) 102     Resp 10/03/21 1354 15     Temp 10/03/21 1354 98.1 F (36.7 C)     Temp Source 10/03/21 1354 Oral     SpO2 10/03/21 1354 99 %     Weight 10/03/21 1350 149 lb 0.5 oz (67.6 kg)     Height 10/03/21 1350 5\' 3"  (1.6 m)     Head Circumference --      Peak Flow --      Pain Score 10/03/21 1350 9     Pain Loc --      Pain Edu? --      Excl. in [place]? --    No data found.  Updated Vital Signs BP 136/66 (BP Location: Left Arm)    Pulse (!) 102    Temp 98.1 F (36.7 C) (Oral)    Resp 15    Ht 5\' 3"  (1.6 m)    Wt 149 lb 0.5 oz (67.6 kg)    SpO2 99%    BMI 26.40 kg/m   Visual Acuity Right Eye Distance:   Left Eye Distance:   Bilateral Distance:    Right Eye Near:   Left Eye Near:    Bilateral Near:      Physical Exam Vitals signs and nursing note reviewed.  Constitutional:      General: She is not in acute distress. Her voice is horsed    Appearance: Normal appearance. She is not ill-appearing, toxic-appearing or diaphoretic.  HENT:     Head: Normocephalic.     Right Ear: Tympanic membrane, ear canal and external ear normal.     Left Ear: Tympanic membrane, ear canal and external ear normal.     Nose: Nose normal.     Mouth/Throat:     Mouth: Mucous membranes are moist.  Eyes:     General: No scleral icterus.       Right eye: No discharge.        Left eye: No discharge.     Conjunctiva/sclera: Conjunctivae normal.  Neck:     Musculoskeletal: Neck supple. No neck rigidity.  Cardiovascular:     Rate and Rhythm: Normal rate and regular  rhythm.     Heart sounds:  No murmur.  Pulmonary:     Effort: Pulmonary effort is normal.     Breath sounds: has wheezing on upper chest anteriorly which resolved after duo neb. And her SOB was improved. Has R mid chest wall tenderness Musculoskeletal: Normal range of motion.  Lymphadenopathy:     Cervical: No cervical adenopathy.  Skin:    General: Skin is warm and dry.     Coloration: Skin is not jaundiced.     Findings: No rash.  Neurological:     Mental Status: She is alert and oriented to person, place, and time.     Gait: Gait normal.  Psychiatric:        Mood and Affect: Mood normal.        Behavior: Behavior normal.        Thought Content: Thought content normal.        Judgment: Judgment normal.    UC Treatments / Results  Labs (all labs ordered are listed, but only abnormal results are displayed) Labs Reviewed - No data to display  EKG   Radiology DG Chest 2 View  Result Date: 10/03/2021 CLINICAL DATA:  Shortness of breath and cough. Status post recent lung surgery for stage I lung cancer. EXAM: CHEST - 2 VIEW COMPARISON:  02/01/2021 FINDINGS: Heart size and mediastinal contours appear normal. There are postsurgical changes with volume loss involving the right upper lobe. No postoperative complications identified. No pleural effusion or edema. No pneumothorax. The visualized osseous structures are unremarkable. IMPRESSION: No acute cardiopulmonary abnormalities. Electronically Signed   By: Kerby Moors M.D.   On: 10/03/2021 14:26    Procedures Procedures (including critical care time)  Medications Ordered in UC Medications  ipratropium-albuterol (DUONEB) 0.5-2.5 (3) MG/3ML nebulizer solution 3 mL (3 mLs Nebulization Given 10/03/21 1455)    Initial Impression / Assessment and Plan / UC Course  I have reviewed the triage vital signs and the nursing notes. Pertinent  imaging results that were available during my care of the patient were reviewed by me and  considered in my medical decision making (see chart for details). URI, Laryngitis, Bronchitis and Costochondritis  She was given a duo neb treatment which helped the SOB and resolved the wheezing.  She was placed on Azithromycin and Albuterol inhaler as noted. Advised to use her Inhaler q 4h.  Advised to go to ER if the SOB comes back or she got worse in the next 24h, since she is post op PE needs to be ruled out. She agreed.    Final Clinical Impressions(s) / UC Diagnoses   Final diagnoses:  Bronchitis  Laryngitis     Discharge Instructions      If your shortness of breath comes back worse, you need to go to the ER     ED Prescriptions     Medication Sig Dispense Auth. Provider   azithromycin (ZITHROMAX Z-PAK) 250 MG tablet Take 2 today, then one qd x 4 days 6 tablet Rodriguez-Southworth, Jeliyah [place], PA-C   albuterol (VENTOLIN HFA) 108 (90 Base) MCG/ACT inhaler Inhale 2 puffs into the lungs every 4 (four) hours as needed for wheezing or shortness of breath. 18 g Rodriguez-Southworth, Sunday Spillers, PA-C      PDMP not reviewed this encounter.   Shelby Mattocks, PA-C 10/03/21 1624    Rodriguez-Southworth, [place], PA-C 10/03/21 1629

## 2021-10-03 NOTE — Discharge Instructions (Addendum)
If your shortness of breath comes back worse, you need to go to the ER

## 2021-10-03 NOTE — ED Triage Notes (Signed)
Patient diagnosed with stage 1 lung cancer and and had lung surgery in Jan 2023.  Patient has been having cough, SOB, and chest congestion for 3 days.  Patient denies fevers.  Patient states that she has a follow-up visit on Tuesday.

## 2021-11-08 ENCOUNTER — Other Ambulatory Visit: Payer: Self-pay

## 2021-11-08 ENCOUNTER — Ambulatory Visit
Admission: EM | Admit: 2021-11-08 | Discharge: 2021-11-08 | Disposition: A | Discharge status: Home / Self Care | Payer: Medicare Other

## 2021-11-08 DIAGNOSIS — R200 Anesthesia of skin: Secondary | ICD-10-CM | POA: Insufficient documentation

## 2021-11-08 DIAGNOSIS — R062 Wheezing: Secondary | ICD-10-CM | POA: Diagnosis not present

## 2021-11-08 DIAGNOSIS — R051 Acute cough: Secondary | ICD-10-CM | POA: Diagnosis not present

## 2021-11-08 DIAGNOSIS — J329 Chronic sinusitis, unspecified: Secondary | ICD-10-CM

## 2021-11-08 DIAGNOSIS — M5090 Cervical disc disorder, unspecified, unspecified cervical region: Secondary | ICD-10-CM | POA: Insufficient documentation

## 2021-11-08 DIAGNOSIS — J40 Bronchitis, not specified as acute or chronic: Secondary | ICD-10-CM | POA: Diagnosis not present

## 2021-11-08 DIAGNOSIS — M542 Cervicalgia: Secondary | ICD-10-CM | POA: Insufficient documentation

## 2021-11-08 MED ORDER — METHYLPREDNISOLONE SODIUM SUCC 40 MG IJ SOLR
60.0000 mg | Freq: Once | INTRAMUSCULAR | Status: AC
  Administered 2021-11-08: 60 mg via INTRAMUSCULAR

## 2021-11-08 MED ORDER — BENZONATATE 100 MG PO CAPS: 100.0000 mg | ORAL_CAPSULE | Freq: Three times a day (TID) | ORAL | 0 refills | Status: AC

## 2021-11-08 MED ORDER — DOXYCYCLINE HYCLATE 100 MG PO CAPS: 100.0000 mg | ORAL_CAPSULE | Freq: Two times a day (BID) | ORAL | 0 refills | Status: AC

## 2021-11-08 MED ORDER — PREDNISONE 10 MG PO TABS: 30.0000 mg | ORAL_TABLET | Freq: Every day | ORAL | 0 refills | Status: AC

## 2021-11-08 MED ORDER — IPRATROPIUM-ALBUTEROL 0.5-2.5 (3) MG/3ML IN SOLN
3.0000 mL | Freq: Once | RESPIRATORY_TRACT | Status: AC
  Administered 2021-11-08: 3 mL via RESPIRATORY_TRACT

## 2021-11-08 NOTE — ED Provider Notes (Signed)
?[facility] ? ? ? ?CSN: [id number] ?Arrival date & time: 11/08/21  1317 ? ? ?  ? ?History   ?Chief Complaint ?Chief Complaint  ?Patient presents with  ? Nasal Congestion  ? ? ?HPI ?Laurie Lyons is a 66 y.o. female.  ? ?Patient presents today with a weeklong history of congestion and cough.  Reports this has since worsened prompting evaluation today.  She has been taking Benadryl without improvement of symptoms.  She has a history of lung cancer and is status post segmentectomy with therapeutic wedge resection 08/19/2021.  She is not currently on chemotherapy.  She does have a history of recurrent allergy/cough symptoms since surgery several months ago.  She was seen by our clinic on 10/03/2021 at which point she was given azithromycin with resolution of symptoms.  Denies additional antibiotic use since that time.  She does have a history of diabetes but is well controlled with last A1c less than 6.4 percent 10/06/2021.  She has taken steroids and notable with this in the past with close monitoring of her blood sugar.  She denies any recent steroid use.  She is having difficulty with daily activities as result of symptoms. ? ? ?Past Medical History:  ?Diagnosis Date  ? Cancer [facility]   ? skin ca  ? Diabetes mellitus without complication ([place])   ? Hyperlipidemia   ? Hypertension   ? Lung cancer ([place])   ? ? ?Patient Active Problem List  ? Diagnosis Date Noted  ? Cervical disc disorder 11/08/2021  ? Neck pain on right side 11/08/2021  ? Numbness and tingling of right upper extremity 11/08/2021  ? 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 Lung nodule 07/07/2021  ? Chronic low back pain 03/26/2020  ? Former smoker 03/26/2020  ? COVID-19 01/09/2020  ? Encounter for screening for lung cancer 02/07/2019  ? Essential hypertension 06/01/2017  ? History of neck pain 06/01/2017  ? History of nonmelanoma skin cancer 03/26/2014  ? Abnormal mammogram 02/27/2014  ? Pure hypercholesterolemia 11/17/2012  ? DM2 (diabetes  mellitus, type 2) ([place]) 07/28/2012  ? ? ?Past Surgical History:  ?Procedure Laterality Date  ? BREAST BIOPSY Right   ? stereo bx-neg-/clip  ? CERVICAL SPINE SURGERY    ? CESAREAN SECTION    ? COLONOSCOPY N/A 06/08/2016  ? Procedure: COLONOSCOPY;  Surgeon: Lollie Sails, MD;  Location: [facility] ENDOSCOPY;  Service: Endoscopy;  Laterality: N/A;  Diabetic  ? LUNG CANCER SURGERY Right   ? TONSILLECTOMY    ? ? ?OB History   ?No obstetric history on file. ?  ? ? ? ?Home Medications   ? ?Prior to Admission medications   ?Medication Sig Start Date End Date Taking? Authorizing Provider  ?Acetaminophen-Codeine 300-30 MG tablet Take 1 tablet by mouth every 4 (four) hours as needed. 01/30/19  Yes [provider]  ?albuterol (VENTOLIN HFA) 108 (90 Base) MCG/ACT inhaler Inhale 2 puffs into the lungs every 4 (four) hours as needed for wheezing or shortness of breath. ?Patient taking differently: Inhale 2 puffs into the lungs every 4 (four) hours as needed for wheezing or shortness of breath. Last used: 10-11am today. 10/03/21  Yes Rodriguez-Southworth, Sunday Spillers, PA-C  ?B-D ULTRAFINE III SHORT PEN 31G X 8 MM MISC SMARTSIG:Injection Once a Week 05/21/21  Yes [provider]  ?benzonatate (TESSALON) 100 MG capsule Take 1 capsule (100 mg total) by mouth every 8 (eight) hours. 11/08/21  Yes Wanda Cellucci K, PA-C  ?diclofenac Sodium (VOLTAREN) 1 % GEL APPLY (  4G) BY TOPICAL ROUTE 4 TIMES EVERY DAY TO THE AFFECTED AREA(S) 06/12/18  Yes [provider]  ?doxycycline (VIBRAMYCIN) 100 MG capsule Take 1 capsule (100 mg total) by mouth 2 (two) times daily. 11/08/21  Yes Petro Talent, Derry Skill, PA-C  ?Dulaglutide (TRULICITY) 1.5 WJ/1.9JY SOPN Inject into the skin. 10/08/21  Yes [provider]  ?glipiZIDE (GLUCOTROL XL) 10 MG 24 hr tablet Take 1 tablet by mouth daily. 11/08/19  Yes [provider]  ?glucose blood (ONETOUCH ULTRA) test strip Use 2 (two) times daily 03/08/19  Yes [provider]  ?ibuprofen  (ADVIL) 800 MG tablet  01/22/18  Yes [provider]  ?imiquimod (ALDARA) 5 % cream APPLY TO AFFECTED AREA THREE TIMES WEEKLY FOR THE MAXIMUM OF 12 WEEKS AND THEN AS NEEDED 02/07/18  Yes [provider]  ?Lidocaine 4 % PTCH 12 hrs on 12 hrs off at site of pain 10/06/21  Yes [provider]  ?lisinopril (ZESTRIL) 20 MG tablet Take 20 mg by mouth daily. 10/09/21  Yes [provider]  ?meclizine (ANTIVERT) 12.5 MG tablet Take by mouth. 05/26/20  Yes [provider]  ?metFORMIN (GLUCOPHAGE) 1000 MG tablet Take by mouth. 03/20/21 01/29/22 Yes [provider]  ?omeprazole (PRILOSEC) 20 MG capsule Take 1 capsule by mouth daily.   Yes [provider]  ?predniSONE (DELTASONE) 10 MG tablet Take 3 tablets (30 mg total) by mouth daily for 3 days. 11/08/21 11/11/21 Yes Lott Seelbach, Derry Skill, PA-C  ?pregabalin (LYRICA) 75 MG capsule Take by mouth. 04/26/19  Yes [provider]  ?rosuvastatin (CRESTOR) 40 MG tablet Take 40 mg by mouth daily.   Yes [provider]  ?betamethasone dipropionate (DIPROLENE) 0.05 % ointment Apply 1 application topically 2 (two) times daily as needed.    [provider]  ?clotrimazole-betamethasone (LOTRISONE) cream APPLY TO AFFECTED AREA TWICE A DAY 05/15/21   [provider]  ?[employer] BISMUTH 262 MG chewable tablet Chew 1 tablet by mouth 4 (four) times daily. 07/23/21   [provider]  ?cyclobenzaprine (FLEXERIL) 5 MG tablet Take 5 mg by mouth 3 (three) times daily. 08/21/21   [provider]  ?docusate sodium (COLACE) 100 MG capsule Take 100 mg by mouth 2 (two) times daily. 08/21/21   [provider]  ?Sanjuan Dame 17 GM/SCOOP powder SMARTSIG:1 Capful(s) By Mouth Daily 08/21/21   [provider]  ?guaiFENesin-codeine 100-10 MG/5ML syrup Take 5 mLs by mouth 2 (two) times daily as needed. 06/19/21   [provider]  ?ibuprofen (ADVIL,MOTRIN) 600 MG tablet Take 1 tablet (600 mg total) by mouth  every 6 (six) hours as needed. 09/30/16   Melynda Ripple, MD  ?meclizine (ANTIVERT) 12.5 MG tablet Take 1 tablet (12.5 mg total) by mouth 3 (three) times daily as needed for dizziness. 09/20/16   Norval Gable, MD  ?metroNIDAZOLE (FLAGYL) 250 MG tablet Take 250 mg by mouth 4 (four) times daily. 07/23/21   [provider]  ?ondansetron (ZOFRAN ODT) 8 MG disintegrating tablet Take 1 tablet (8 mg total) by mouth every 8 (eight) hours as needed. 09/20/16   Norval Gable, MD  ?oxyCODONE (OXY IR/ROXICODONE) 5 MG immediate release tablet Take 5 mg by mouth every 8 (eight) hours as needed. 09/04/21   [provider]  ?zolpidem (AMBIEN) 5 MG tablet Take 5 mg by mouth daily. 11/19/20   [provider]  ? ? ?Family History ?Family History  ?Problem Relation Age of Onset  ? Breast cancer Cousin   ?  mat cousin  ? Diabetes Father   ? ? ?Social History ?Social History  ? ?Tobacco Use  ? Smoking status: Former  ?  Packs/day: 2.00  ?  Years: 35.00  ?  Pack years: 70.00  ?  Types: Cigarettes  ?  Quit date: 08/16/2006  ?  Years since quitting: 15.2  ? Smokeless tobacco: Never  ?Vaping Use  ? Vaping Use: Never used  ?Substance Use Topics  ? Alcohol use: No  ? Drug use: No  ? ? ? ?Allergies   ?Empagliflozin, Dextromethorphan, Guanfacine, Hydrocodone, Penicillins, and Sulfa antibiotics ? ? ?Review of Systems ?Review of Systems  ?Constitutional:  Positive for activity change. Negative for appetite change, fatigue and fever.  ?HENT:  Positive for congestion. Negative for sinus pressure, sneezing and sore throat.   ?Respiratory:  Positive for cough, chest tightness and shortness of breath. Negative for wheezing.   ?Cardiovascular:  Negative for chest pain.  ?Gastrointestinal:  Negative for abdominal pain, diarrhea, nausea and vomiting.  ?Neurological:  Negative for dizziness, light-headedness and headaches.  ? ? ?Physical Exam ?Triage Vital Signs ?ED Triage Vitals  ?Enc Vitals Group  ?   BP 11/08/21 1351 136/77  ?    Pulse Rate 11/08/21 1351 78  ?   Resp 11/08/21 1351 20  ?   Temp 11/08/21 1351 98 ?F (36.7 ?C)  ?   Temp Source 11/08/21 1351 Oral  ?   SpO2 11/08/21 1351 99 %  ?   Weight 11/08/21 1346 144 lb (65.3 kg)

## 2021-11-08 NOTE — ED Triage Notes (Signed)
@   home COVID19 test done today: Negative.  ?

## 2021-11-08 NOTE — ED Triage Notes (Signed)
Patient is here for "congestion & sinus pain/pressure" with dry cough. Has been about 6 days now. Grandson stayed recently "and had a cold". History of lung cancer (and left lung surgical procedure). Concerned with SOB/wheezing.  ?

## 2021-11-08 NOTE — Discharge Instructions (Addendum)
I believe that you have a upper respiratory infection that is leading to wheezing and chest tightness.  Continue your albuterol every 4-6 hours as needed.  Start prednisone burst tomorrow.  This can raise your blood sugar so please monitor your blood sugar regularly.  Do not take NSAIDs including aspirin, ibuprofen/Advil, naproxen/Aleve with prednisone as it can cause stomach bleeding.  Take doxycycline 100 mg twice daily for 10 days.  This will cover for infection.  Use Tessalon for cough.  If you have any worsening symptoms including high fever, chest pain, shortness of breath, recurrent wheezing not responding to albuterol, nausea/vomiting you need to be seen immediately. ?

## 2022-07-13 ENCOUNTER — Other Ambulatory Visit: Payer: Self-pay | Admitting: Orthopaedic Surgery

## 2022-07-13 DIAGNOSIS — M5412 Radiculopathy, cervical region: Secondary | ICD-10-CM

## 2022-07-20 ENCOUNTER — Ambulatory Visit
Admission: RE | Admit: 2022-07-20 | Discharge: 2022-07-20 | Disposition: A | Discharge status: Home / Self Care | Payer: Medicare Other | Source: Ambulatory Visit | Attending: Orthopaedic Surgery | Admitting: Orthopaedic Surgery

## 2022-07-20 DIAGNOSIS — M5412 Radiculopathy, cervical region: Secondary | ICD-10-CM | POA: Insufficient documentation

## 2022-08-01 IMAGING — CR DG CHEST 2V
2 series · 2 of 2 positions shown · non-contrast
Comparison: 02/01/2021

CLINICAL DATA: Shortness of breath and cough. Status post recent
lung surgery for stage I lung cancer.

EXAM:
CHEST - 2 VIEW

[chest pa]
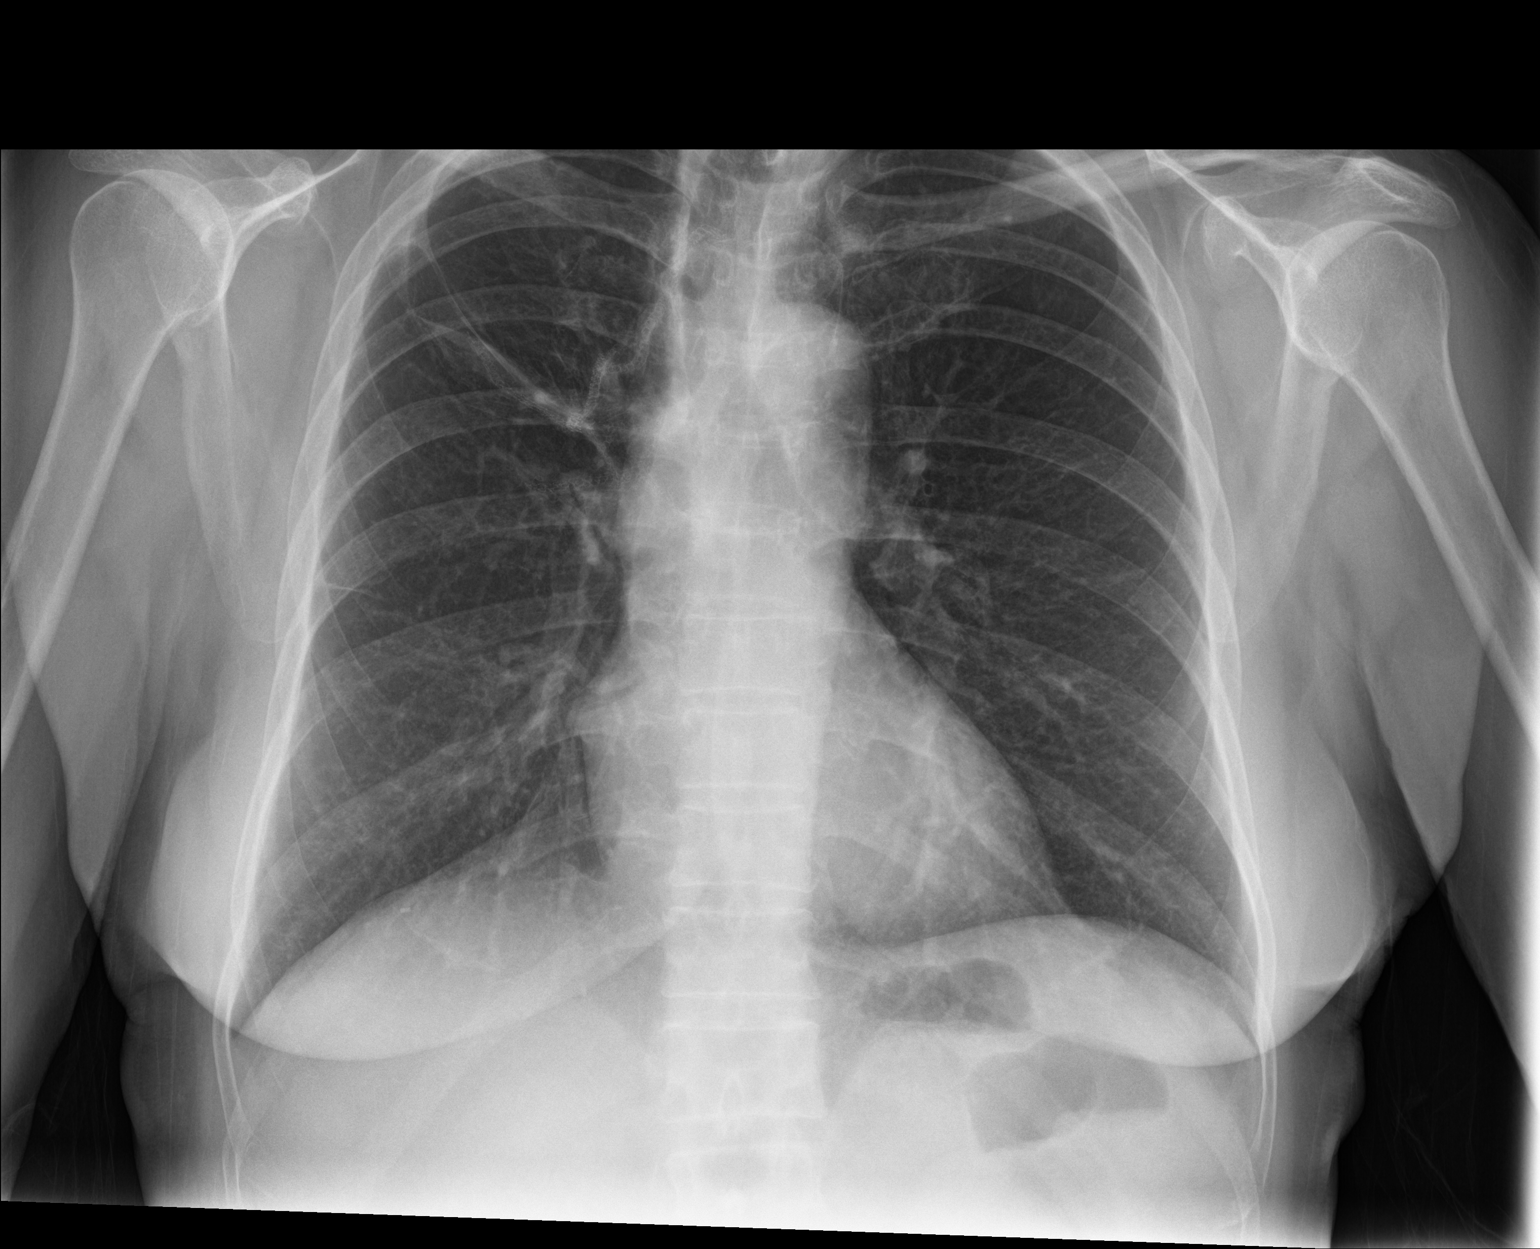

[chest lat]
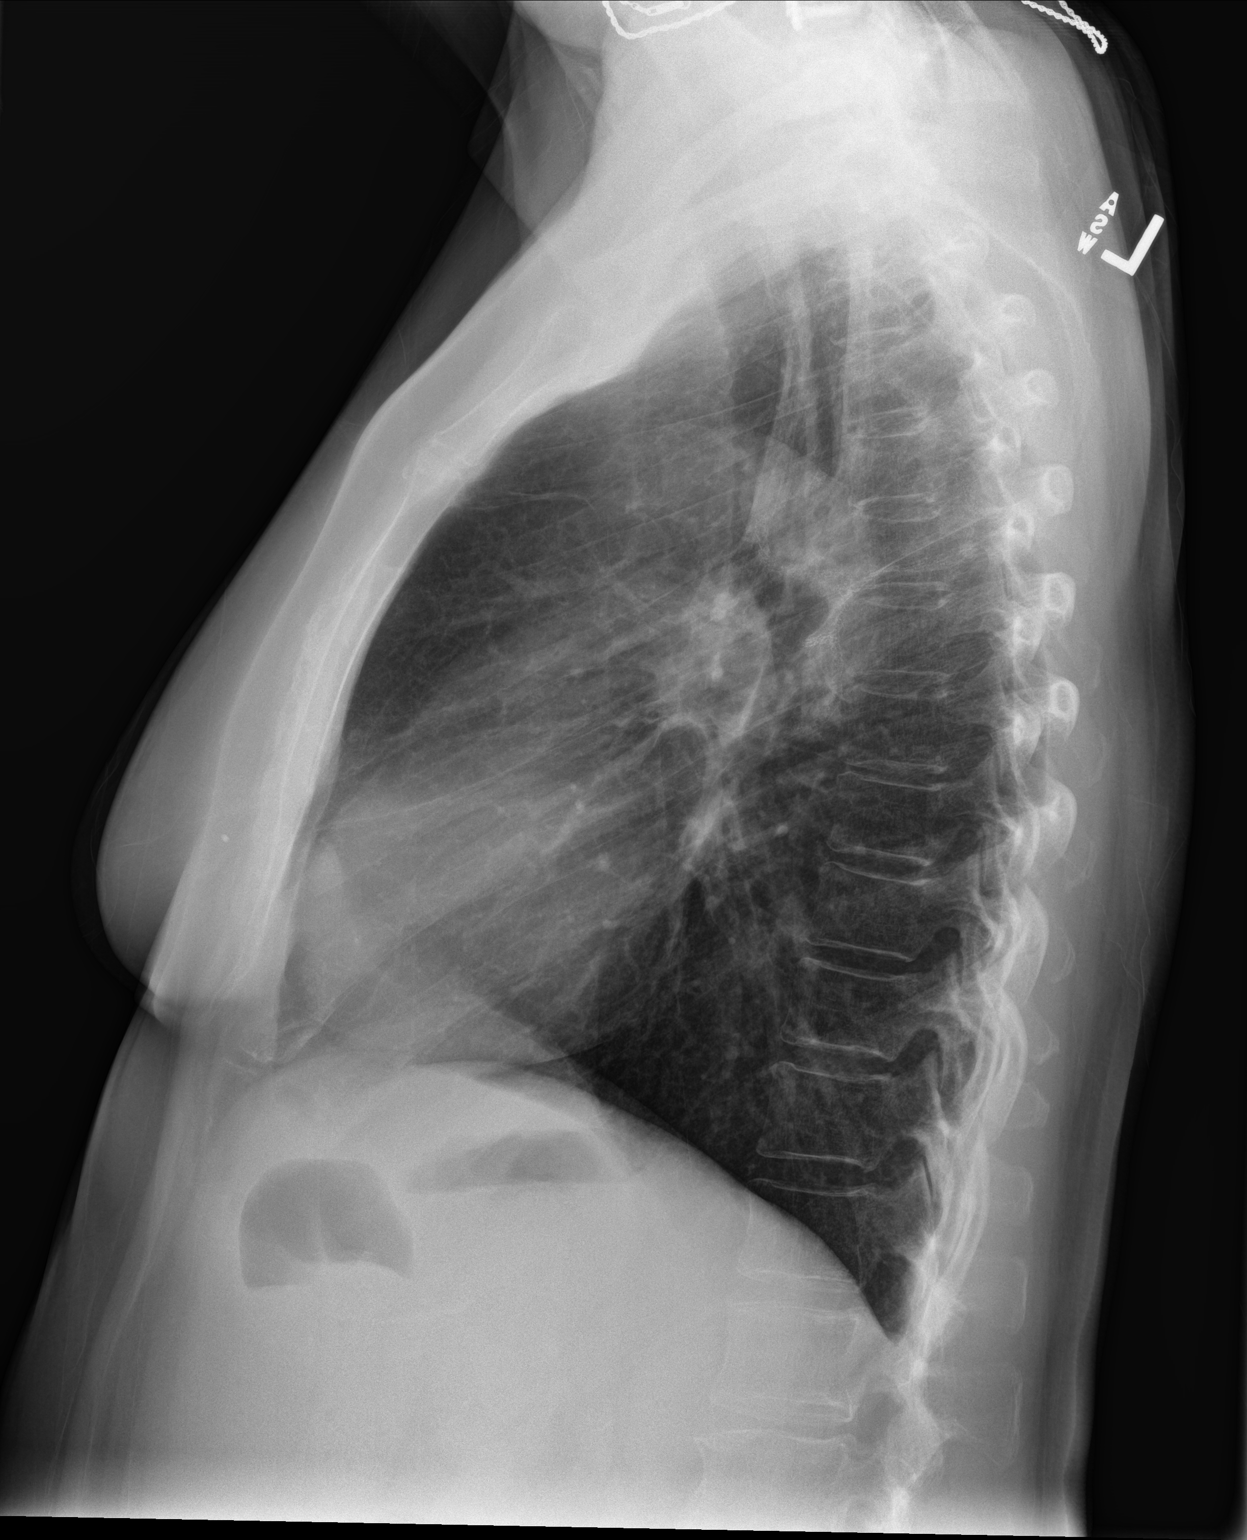

[2 of 2 positions shown; findings below may reference images not displayed]

FINDINGS: Heart size and mediastinal contours appear normal. There are
postsurgical changes with volume loss involving the right upper
lobe. No postoperative complications identified. No pleural effusion
or edema. No pneumothorax.

The visualized osseous structures are unremarkable.
IMPRESSION: No acute cardiopulmonary abnormalities.

## 2023-01-16 ENCOUNTER — Other Ambulatory Visit: Payer: Self-pay

## 2023-01-16 ENCOUNTER — Emergency Department (HOSPITAL_COMMUNITY): Payer: Medicare HMO

## 2023-01-16 ENCOUNTER — Encounter (HOSPITAL_COMMUNITY): Payer: Self-pay | Admitting: Emergency Medicine

## 2023-01-16 ENCOUNTER — Emergency Department (HOSPITAL_COMMUNITY)
Admission: EM | Admit: 2023-01-16 | Discharge: 2023-01-16 | Disposition: A | Discharge status: Home / Self Care | Payer: Medicare HMO | Attending: Emergency Medicine | Admitting: Emergency Medicine

## 2023-01-16 DIAGNOSIS — K760 Fatty (change of) liver, not elsewhere classified: Secondary | ICD-10-CM | POA: Insufficient documentation

## 2023-01-16 DIAGNOSIS — R072 Precordial pain: Secondary | ICD-10-CM | POA: Insufficient documentation

## 2023-01-16 DIAGNOSIS — K5730 Diverticulosis of large intestine without perforation or abscess without bleeding: Secondary | ICD-10-CM | POA: Insufficient documentation

## 2023-01-16 DIAGNOSIS — Z7984 Long term (current) use of oral hypoglycemic drugs: Secondary | ICD-10-CM | POA: Insufficient documentation

## 2023-01-16 DIAGNOSIS — Y92410 Unspecified street and highway as the place of occurrence of the external cause: Secondary | ICD-10-CM | POA: Insufficient documentation

## 2023-01-16 DIAGNOSIS — Z7951 Long term (current) use of inhaled steroids: Secondary | ICD-10-CM | POA: Insufficient documentation

## 2023-01-16 DIAGNOSIS — Z79899 Other long term (current) drug therapy: Secondary | ICD-10-CM | POA: Insufficient documentation

## 2023-01-16 LAB — CBC WITH DIFFERENTIAL/PLATELET
Abs Immature Granulocytes: 0.03 10*3/uL (ref 0.00–0.07)
Basophils Absolute: 0.1 10*3/uL (ref 0.0–0.1)
Basophils Relative: 1 %
Eosinophils Absolute: 0.5 10*3/uL (ref 0.0–0.5)
Eosinophils Relative: 6 %
HCT: 44 % (ref 36.0–46.0)
Hemoglobin: 14 g/dL (ref 12.0–15.0)
Immature Granulocytes: 0 %
Lymphocytes Relative: 27 %
Lymphs Abs: 2.1 10*3/uL (ref 0.7–4.0)
MCH: 28.9 pg (ref 26.0–34.0)
MCHC: 31.8 g/dL (ref 30.0–36.0)
MCV: 90.7 fL (ref 80.0–100.0)
Monocytes Absolute: 0.6 10*3/uL (ref 0.1–1.0)
Monocytes Relative: 8 %
Neutro Abs: 4.5 10*3/uL (ref 1.7–7.7)
Neutrophils Relative %: 58 %
Platelets: 263 10*3/uL (ref 150–400)
RBC: 4.85 MIL/uL (ref 3.87–5.11)
RDW: 13.9 % (ref 11.5–15.5)
WBC: 7.7 10*3/uL (ref 4.0–10.5)
nRBC: 0 % (ref 0.0–0.2)

## 2023-01-16 LAB — TYPE AND SCREEN
ABO/RH(D): O POS
Antibody Screen: NEGATIVE

## 2023-01-16 LAB — I-STAT CHEM 8, ED
BUN: 20 mg/dL (ref 8–23)
Calcium, Ion: 1.19 mmol/L (ref 1.15–1.40)
Chloride: 104 mmol/L (ref 98–111)
Creatinine, Ser: 0.7 mg/dL (ref 0.44–1.00)
Glucose, Bld: 218 mg/dL — ABNORMAL HIGH (ref 70–99)
HCT: 41 % (ref 36.0–46.0)
Hemoglobin: 13.9 g/dL (ref 12.0–15.0)
Potassium: 4 mmol/L (ref 3.5–5.1)
Sodium: 137 mmol/L (ref 135–145)
TCO2: 21 mmol/L — ABNORMAL LOW (ref 22–32)

## 2023-01-16 LAB — BASIC METABOLIC PANEL
Anion gap: 15 (ref 5–15)
BUN: 19 mg/dL (ref 8–23)
CO2: 18 mmol/L — ABNORMAL LOW (ref 22–32)
Calcium: 9.9 mg/dL (ref 8.9–10.3)
Chloride: 104 mmol/L (ref 98–111)
Creatinine, Ser: 0.75 mg/dL (ref 0.44–1.00)
GFR, Estimated: >60 mL/min (ref >60)
Glucose, Bld: 219 mg/dL — ABNORMAL HIGH (ref 70–99)
Potassium: 4.2 mmol/L (ref 3.5–5.1)
Sodium: 137 mmol/L (ref 135–145)

## 2023-01-16 LAB — ETHANOL: Alcohol, Ethyl (B): <10 mg/dL (ref <10)

## 2023-01-16 MED ORDER — MORPHINE SULFATE (PF) 4 MG/ML IV SOLN
4.0000 mg | Freq: Once | INTRAVENOUS | Status: AC
  Administered 2023-01-16: 4 mg via INTRAVENOUS
  Filled 2023-01-16: qty 1

## 2023-01-16 MED ORDER — OXYCODONE-ACETAMINOPHEN 5-325 MG PO TABS: 1.0000 | ORAL_TABLET | Freq: Four times a day (QID) | ORAL | 0 refills | Status: AC | PRN

## 2023-01-16 MED ORDER — IOHEXOL 350 MG/ML SOLN
75.0000 mL | Freq: Once | INTRAVENOUS | Status: AC | PRN
  Administered 2023-01-16: 75 mL via INTRAVENOUS

## 2023-01-16 NOTE — ED Notes (Signed)
Pt ambulated to restroom with standby assist.  Urine sample on sink in room if needed
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
## 2023-01-16 NOTE — Discharge Instructions (Signed)
Return for any problem.  ?

## 2024-01-19 ENCOUNTER — Ambulatory Visit
Admission: EM | Admit: 2024-01-19 | Discharge: 2024-01-19 | Disposition: A | Discharge status: Home / Self Care | Attending: Emergency Medicine | Admitting: Emergency Medicine

## 2024-01-19 ENCOUNTER — Encounter: Payer: Self-pay | Admitting: Emergency Medicine

## 2024-01-19 DIAGNOSIS — B029 Zoster without complications: Secondary | ICD-10-CM | POA: Diagnosis not present

## 2024-01-19 MED ORDER — VALACYCLOVIR HCL 1 G PO TABS: 1000.0000 mg | ORAL_TABLET | Freq: Three times a day (TID) | ORAL | 0 refills | Status: AC
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
## 2024-01-19 NOTE — ED Triage Notes (Signed)
 Pt presents with a rash on her right shoulder x 2 days.

## 2024-01-19 NOTE — Discharge Instructions (Addendum)
 Valtrex 1000 mg 3 times a day for 7 days for treatment of shingles.  You may use cool compresses on the affected area to help provide comfort.  You may also apply aluminum acetate (found in most anti-itch cream ) lotion to the area to help ease the itching, or use topical Benadryl cream.  Use over-the-counter Tylenol  and or ibuprofen  as needed for pain.  If you continue to have pain after the rash resolves follow-up with your primary care provider for medication options.  Keep the area covered until after the blisters have ruptured and dried up.  Once they have dried up you no longer need to keep a dressing in place.  Avoid contact with unvaccinated children, pregnant women, or those who are immunocompromised or who are taking immune suppressive medication such as Humira, prednisone , methotrexate, or chemotherapy.   Once the infection has resolved I suggest that you make an appointment to have your shingles vaccination.
